# Patient Record
Sex: Female | Born: 1940 | Race: Black or African American | Hispanic: No | Marital: Married | Smoking: Never smoker
Health system: Southern US, Community
[De-identification: ages and names within clinical notes are randomized; demographics above are authoritative.]

## PROBLEM LIST (undated history)

## (undated) DIAGNOSIS — I679 Cerebrovascular disease, unspecified: Secondary | ICD-10-CM

## (undated) DIAGNOSIS — I2510 Atherosclerotic heart disease of native coronary artery without angina pectoris: Secondary | ICD-10-CM

## (undated) DIAGNOSIS — I10 Essential (primary) hypertension: Secondary | ICD-10-CM

## (undated) DIAGNOSIS — E118 Type 2 diabetes mellitus with unspecified complications: Secondary | ICD-10-CM

## (undated) DIAGNOSIS — G459 Transient cerebral ischemic attack, unspecified: Secondary | ICD-10-CM

---

## 1999-02-03 ENCOUNTER — Observation Stay (HOSPITAL_COMMUNITY): Admission: EM | Admit: 1999-02-03 | Discharge: 1999-02-04 | Payer: Self-pay | Admitting: *Deleted

## 1999-02-03 ENCOUNTER — Encounter: Payer: Self-pay | Admitting: *Deleted

## 2014-12-21 ENCOUNTER — Other Ambulatory Visit (HOSPITAL_COMMUNITY): Payer: Self-pay | Admitting: Interventional Radiology

## 2014-12-21 DIAGNOSIS — G459 Transient cerebral ischemic attack, unspecified: Secondary | ICD-10-CM

## 2014-12-26 ENCOUNTER — Other Ambulatory Visit (HOSPITAL_COMMUNITY): Payer: Self-pay | Admitting: Interventional Radiology

## 2014-12-26 ENCOUNTER — Ambulatory Visit (HOSPITAL_COMMUNITY)
Admission: RE | Admit: 2014-12-26 | Discharge: 2014-12-26 | Disposition: A | Discharge status: Home / Self Care | Payer: Medicare Other | Source: Ambulatory Visit | Attending: Interventional Radiology | Admitting: Interventional Radiology

## 2014-12-26 DIAGNOSIS — G459 Transient cerebral ischemic attack, unspecified: Secondary | ICD-10-CM

## 2014-12-26 DIAGNOSIS — I771 Stricture of artery: Secondary | ICD-10-CM

## 2014-12-27 ENCOUNTER — Telehealth (HOSPITAL_COMMUNITY): Payer: Self-pay

## 2014-12-27 ENCOUNTER — Other Ambulatory Visit: Payer: Self-pay | Admitting: Radiology

## 2014-12-27 NOTE — Telephone Encounter (Signed)
Tried to call pt back to give appt time for IR procedure, number in system is not valid. AW

## 2014-12-29 ENCOUNTER — Other Ambulatory Visit: Payer: Self-pay | Admitting: Physician Assistant

## 2014-12-29 ENCOUNTER — Other Ambulatory Visit: Payer: Self-pay | Admitting: Radiology

## 2014-12-30 ENCOUNTER — Other Ambulatory Visit (HOSPITAL_COMMUNITY): Payer: Self-pay | Admitting: Interventional Radiology

## 2014-12-30 ENCOUNTER — Encounter (HOSPITAL_COMMUNITY): Payer: Self-pay

## 2014-12-30 ENCOUNTER — Ambulatory Visit (HOSPITAL_COMMUNITY)
Admission: RE | Admit: 2014-12-30 | Discharge: 2014-12-30 | Disposition: A | Discharge status: Home / Self Care | Payer: Medicare Other | Source: Ambulatory Visit | Attending: Interventional Radiology | Admitting: Interventional Radiology

## 2014-12-30 DIAGNOSIS — Z7982 Long term (current) use of aspirin: Secondary | ICD-10-CM | POA: Insufficient documentation

## 2014-12-30 DIAGNOSIS — I2510 Atherosclerotic heart disease of native coronary artery without angina pectoris: Secondary | ICD-10-CM | POA: Insufficient documentation

## 2014-12-30 DIAGNOSIS — Z91040 Latex allergy status: Secondary | ICD-10-CM | POA: Diagnosis not present

## 2014-12-30 DIAGNOSIS — Z880 Allergy status to penicillin: Secondary | ICD-10-CM | POA: Insufficient documentation

## 2014-12-30 DIAGNOSIS — I771 Stricture of artery: Secondary | ICD-10-CM

## 2014-12-30 DIAGNOSIS — Z8673 Personal history of transient ischemic attack (TIA), and cerebral infarction without residual deficits: Secondary | ICD-10-CM | POA: Insufficient documentation

## 2014-12-30 DIAGNOSIS — G450 Vertebro-basilar artery syndrome: Secondary | ICD-10-CM | POA: Diagnosis not present

## 2014-12-30 DIAGNOSIS — I10 Essential (primary) hypertension: Secondary | ICD-10-CM | POA: Diagnosis not present

## 2014-12-30 HISTORY — DX: Atherosclerotic heart disease of native coronary artery without angina pectoris: I25.10

## 2014-12-30 HISTORY — DX: Essential (primary) hypertension: I10

## 2014-12-30 HISTORY — DX: Cerebrovascular disease, unspecified: I67.9

## 2014-12-30 HISTORY — DX: Transient cerebral ischemic attack, unspecified: G45.9

## 2014-12-30 LAB — CBC WITH DIFFERENTIAL/PLATELET
Basophils Absolute: 0 10*3/uL (ref 0.0–0.1)
Basophils Relative: 0 % (ref 0–1)
EOS ABS: 0.2 10*3/uL (ref 0.0–0.7)
Eosinophils Relative: 3 % (ref 0–5)
HCT: 38.1 % (ref 36.0–46.0)
Hemoglobin: 13 g/dL (ref 12.0–15.0)
LYMPHS ABS: 2.7 10*3/uL (ref 0.7–4.0)
Lymphocytes Relative: 45 % (ref 12–46)
MCH: 30 pg (ref 26.0–34.0)
MCHC: 34.1 g/dL (ref 30.0–36.0)
MCV: 87.8 fL (ref 78.0–100.0)
MONO ABS: 0.4 10*3/uL (ref 0.1–1.0)
Monocytes Relative: 7 % (ref 3–12)
NEUTROS PCT: 45 % (ref 43–77)
Neutro Abs: 2.8 10*3/uL (ref 1.7–7.7)
Platelets: 207 10*3/uL (ref 150–400)
RBC: 4.34 MIL/uL (ref 3.87–5.11)
RDW: 13.4 % (ref 11.5–15.5)
WBC: 6.1 10*3/uL (ref 4.0–10.5)

## 2014-12-30 LAB — BASIC METABOLIC PANEL
Anion gap: 9 (ref 5–15)
BUN: 15 mg/dL (ref 6–20)
CO2: 30 mmol/L (ref 22–32)
CREATININE: 0.82 mg/dL (ref 0.44–1.00)
Calcium: 9.8 mg/dL (ref 8.9–10.3)
Chloride: 100 mmol/L — ABNORMAL LOW (ref 101–111)
GFR calc Af Amer: >60 mL/min (ref >60)
GFR calc non Af Amer: >60 mL/min (ref >60)
Glucose, Bld: 122 mg/dL — ABNORMAL HIGH (ref 65–99)
POTASSIUM: 3.5 mmol/L (ref 3.5–5.1)
Sodium: 139 mmol/L (ref 135–145)

## 2014-12-30 LAB — PROTIME-INR
INR: 1.15 (ref 0.00–1.49)
PROTHROMBIN TIME: 14.9 s (ref 11.6–15.2)

## 2014-12-30 MED ORDER — HEPARIN SODIUM (PORCINE) 1000 UNIT/ML IJ SOLN
INTRAMUSCULAR | Status: AC
  Filled 2014-12-30: qty 1

## 2014-12-30 MED ORDER — MIDAZOLAM HCL 2 MG/2ML IJ SOLN
INTRAMUSCULAR | Status: AC
  Filled 2014-12-30: qty 2

## 2014-12-30 MED ORDER — IOHEXOL 300 MG/ML  SOLN
200.0000 mL | Freq: Once | INTRAMUSCULAR | Status: AC | PRN
  Administered 2014-12-30: 90 mL via INTRAVENOUS

## 2014-12-30 MED ORDER — FENTANYL CITRATE (PF) 100 MCG/2ML IJ SOLN
INTRAMUSCULAR | Status: AC | PRN
  Administered 2014-12-30: 25 ug via INTRAVENOUS

## 2014-12-30 MED ORDER — LIDOCAINE HCL 1 % IJ SOLN
INTRAMUSCULAR | Status: AC
  Filled 2014-12-30: qty 20

## 2014-12-30 MED ORDER — SODIUM CHLORIDE 0.9 % IV SOLN: INTRAVENOUS | Status: AC

## 2014-12-30 MED ORDER — HEPARIN SODIUM (PORCINE) 1000 UNIT/ML IJ SOLN
INTRAMUSCULAR | Status: AC | PRN
  Administered 2014-12-30: 1000 [IU] via INTRAVENOUS

## 2014-12-30 MED ORDER — SODIUM CHLORIDE 0.9 % IV SOLN
INTRAVENOUS | Status: DC
  Administered 2014-12-30: 07:00:00 via INTRAVENOUS

## 2014-12-30 MED ORDER — MIDAZOLAM HCL 2 MG/2ML IJ SOLN
INTRAMUSCULAR | Status: AC | PRN
  Administered 2014-12-30: 1 mg via INTRAVENOUS

## 2014-12-30 MED ORDER — FENTANYL CITRATE (PF) 100 MCG/2ML IJ SOLN
INTRAMUSCULAR | Status: AC
  Filled 2014-12-30: qty 2

## 2014-12-30 NOTE — H&P (Signed)
Chief Complaint: Patient was seen in consultation today for cerebral arteriogram  at the request of Dr. Dewain Penning  Referring Physician(s): Dr. William Hamburger  History of Present Illness: Breanna Nash is a 74 y.o. 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 She was referred to Dr. Corliss Skains, who saw her 8/3 She is now scheduled for cerebral arteriogram.  Past Medical History  Diagnosis Date  . HTN (hypertension)   . TIA (transient ischemic attack)   . CVD (cerebrovascular disease)   . CAD (coronary artery disease)      No past surgical history on file.  Allergies: Penicillins; Percocet; and Latex  Medications: Prior to Admission medications   Medication Sig Start Date End Date Taking? Authorizing Provider  amLODipine (NORVASC) 5 MG tablet Take 5 mg by mouth daily. 11/15/14   Historical Provider, MD  aspirin 325 MG tablet Take 325 mg by mouth daily. 12/06/14 12/06/15  Historical Provider, MD  atenolol (TENORMIN) 50 MG tablet Take 50 mg by mouth daily. 05/10/14   Historical Provider, MD  atorvastatin (LIPITOR) 40 MG tablet Take 40 mg by mouth every evening. 05/10/14   Historical Provider, MD  diclofenac sodium (VOLTAREN) 1 % GEL Apply 2 g topically 4 (four) times daily. 06/10/14 06/10/15  Historical Provider, MD  famotidine (PEPCID) 40 MG tablet Take 40 mg by mouth 2 (two) times daily. 12/06/14 12/06/15  Historical Provider, MD  Hydrocodone-Acetaminophen 10-300 MG TABS Take 1 tablet by mouth every 4 (four) hours as needed (pain).  12/13/14   Historical Provider, MD  lisinopril-hydrochlorothiazide (PRINZIDE,ZESTORETIC) 20-12.5 MG per tablet Take 1 tablet by mouth daily. 11/15/14   Historical Provider, MD  promethazine (PHENERGAN) 25 MG tablet Take 25 mg by mouth every 6 (six) hours as needed for nausea or vomiting.  12/13/14   Historical Provider, MD     No family history on file.  History   Social History  .  Marital Status: Married    Spouse Name: N/A  . Number of Children: N/A  . Years of Education: N/A   Social History Main Topics  . Smoking status: Not on file  . Smokeless tobacco: Not on file  . Alcohol Use: Not on file  . Drug Use: Not on file  . Sexual Activity: Not on file   Other Topics Concern  . Not on file   Social History Narrative  . No narrative on file      Review of Systems: A 12 point ROS discussed and pertinent positives are indicated in the HPI above.  All other systems are negative.  Review of Systems  Vital Signs: BP 145/68 mmHg  Pulse 64  Temp(Src) 97.8 F (36.6 C)  Resp 18  Ht 5\' 2"  (1.575 m)  Wt 175 lb (79.379 kg)  BMI 32.00 kg/m2  SpO2 100%  Physical Exam  Constitutional: She appears well-developed and well-nourished. No distress.  HENT:  Head: Normocephalic.  Mouth/Throat: Oropharynx is clear and moist.  Neck: Normal range of motion. No JVD present. No tracheal deviation present.  Cardiovascular: Normal rate, regular rhythm and normal heart sounds.   Pulmonary/Chest: Effort normal and breath sounds normal. No respiratory distress.  Abdominal: She exhibits no distension.  Neurological: She is alert.  Psychiatric: She has a normal mood and affect. Judgment normal.    Imaging: Ir Radiologist Eval & Mgmt  12/28/2014   EXAM: NEW PATIENT OFFICE VISIT  CHIEF COMPLAINT: Headaches.  Dizziness.  Vertebrobasilar insufficiency.  Current Pain Level: 1-10  HISTORY OF PRESENT ILLNESS: Patient is a 74 year old, right-handed lady who has been referred for evaluation following a transient ischemic attack involving vertebrobasilar insufficiency.  The patient is accompanied by her husband and her son.  According to the patient she was admitted at [facility] on the tenth of this month with symptoms of sudden onset of slurred speech, dizziness comprising of vertigo, difficulty with maintaining her balance, and also dysarthria lasting approximately  20 minutes.  This was apparently proceeded by sharp pain in both her eyes.  The patient on admission there had a blood pressure of approximately 225/115.  This was initially brought under control with the patient's symptoms receding.  During her stay at the hospital, the patient underwent workup comprising of MRI scan of the brain, and CT angiogram of the head and neck.  She also underwent an ultrasound of her neck.  These studies revealed the presence of a probable high-grade stenosis of the proximal left posterior cerebral artery. Additionally there appeared to be suggestion of a moderate to severe narrowing of the left vertebral artery at its origin associated with a calcified plaque.  Since the discharge, the patient has had no further similar spells. She does have a history of chronic hypertension. She admits to not taking her antihypertensive medicines regularly.  Past Medical History: Arthritis. High blood pressure. High cholesterol. Reflux indigestion.  Patient is also apparently waiting to have rotator cuff surgery performed.  Medications: Atenolol. Premarin. Clobetasol. Lisinopril hydrochlorothiazide combination, amlodipine, Protonix. Atorvastatin.  Allergies: No known allergies.  Social History: Patient is married.  Has 2 children alive and well.  She denies drinking alcohol. Denies smoking at this time. However has admitted to smoking in the past. Denies using illicit chemicals.  Family History: Father died of lung carcinoma. Heart problems in her mother. High blood pressure. Mother had a stroke at age 88.  REVIEW OF SYSTEMS: Essentially negative except for as mentioned above.  PHYSICAL EXAMINATION: Apparently slightly anxious.  In no acute distress.  Affect normal.  Neurologically, no lateralizing cranial nerve abnormalities, motor, sensory or coordination problems. Alert, awake, oriented to time, place, space. Speech and comprehension normal.  ASSESSMENT AND PLAN: Patient's recent MRI scan of the brain  and CT angiogram of the brain were reviewed with her and her family.  The MRI scan of the brain reveals nonspecific subcortical white matter changes supratentorially and the centrum semiovale and also in the corona radiata.  The CT angiogram reveals a suggestion of a tight focal stenoses of the proximal left posterior cerebral artery. Additionally there is suggestion of narrowing at the origin of the left vertebral artery which is masked by calcified plaque.  The patient was informed that in order to further accurately delineate the degree of narrowing in the above mentioned blood vessels, a formal catheter angiogram would be needed.  The procedure, the risks, the benefits, the alternatives were all reviewed in detail.  Questions were answered.  The patient and family are willing to proceed with a diagnostic catheter angiogram which will be performed shortly.  Patient has been warned to continue taking her blood pressure on a regular basis. Also she was advised to check with her primary care doctor in regards to testing for diabetes in view of the fact that she has had raised blood glucose levels.  Patient and her family leave with good understanding and agreement with above management plan. They were asked to call should they have any concerns or questions.  Electronically Signed   By: Julieanne Cotton M.D.   On: 12/26/2014 15:50    Labs:  CBC:  Recent Labs  12/30/14 0708  WBC 6.1  HGB 13.0  HCT 38.1  PLT 207    COAGS:  Recent Labs  12/30/14 0708  INR 1.15    BMP: No results for input(s): NA, K, CL, CO2, GLUCOSE, BUN, CALCIUM, CREATININE, GFRNONAA, GFRAA in the last 8760 hours.  Invalid input(s): CMP  LIVER FUNCTION TESTS: No results for input(s): BILITOT, AST, ALT, ALKPHOS, PROT, ALBUMIN in the last 8760 hours.  TUMOR MARKERS: No results for input(s): AFPTM, CEA, CA199, CHROMGRNA in the last 8760 hours.  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 Consent signed in chart  Signed: Brayton El 12/30/2014, 7:49 AM

## 2014-12-30 NOTE — Discharge Instructions (Signed)

## 2014-12-30 NOTE — Sedation Documentation (Signed)
Exoseal intact. Pressure was held and done

## 2014-12-30 NOTE — Sedation Documentation (Addendum)
Exoseal R groin- IR tech holding pressure to R groin; instructions given about bedrest and head/leg restrictions

## 2014-12-30 NOTE — Sedation Documentation (Addendum)
DP pulses 2+, PT 1+

## 2014-12-30 NOTE — Procedures (Signed)
S/P 4 vessel cerebral arteriogram. RT CFA approach. Findings. 1.Approx 75 % stenosis LT PCA P1-2 junction,and 50% RT PCA P1-2 junction

## 2015-08-24 ENCOUNTER — Telehealth (HOSPITAL_COMMUNITY): Payer: Self-pay

## 2015-08-24 NOTE — Telephone Encounter (Signed)
Have made multiple attempts to call pt on home and work to schedule MRI with Dr. Corliss Skainseveshwar, both numbers keep ringing busy. Will try back later. AW

## 2015-10-31 ENCOUNTER — Other Ambulatory Visit (HOSPITAL_COMMUNITY): Payer: Self-pay | Admitting: Interventional Radiology

## 2015-10-31 DIAGNOSIS — I771 Stricture of artery: Secondary | ICD-10-CM

## 2015-11-07 ENCOUNTER — Ambulatory Visit (HOSPITAL_COMMUNITY)
Admission: RE | Admit: 2015-11-07 | Discharge: 2015-11-07 | Disposition: A | Discharge status: Home / Self Care | Payer: Medicare Other | Source: Ambulatory Visit | Attending: Interventional Radiology | Admitting: Interventional Radiology

## 2015-11-07 DIAGNOSIS — I771 Stricture of artery: Secondary | ICD-10-CM | POA: Insufficient documentation

## 2015-11-07 LAB — CREATININE, SERUM
CREATININE: 0.75 mg/dL (ref 0.44–1.00)
GFR calc Af Amer: >60 mL/min (ref >60)

## 2015-11-07 MED ORDER — GADOBENATE DIMEGLUMINE 529 MG/ML IV SOLN
18.0000 mL | Freq: Once | INTRAVENOUS | Status: AC | PRN
  Administered 2015-11-07: 20 mL via INTRAVENOUS

## 2015-12-05 ENCOUNTER — Telehealth (HOSPITAL_COMMUNITY): Payer: Self-pay

## 2015-12-05 NOTE — Telephone Encounter (Signed)
Pt agreed to f/u in 6 months with a MRI. AW 

## 2016-05-07 ENCOUNTER — Other Ambulatory Visit (HOSPITAL_COMMUNITY): Payer: Self-pay | Admitting: Interventional Radiology

## 2016-05-07 DIAGNOSIS — I771 Stricture of artery: Secondary | ICD-10-CM

## 2016-06-19 ENCOUNTER — Ambulatory Visit (HOSPITAL_COMMUNITY)
Admission: RE | Admit: 2016-06-19 | Payer: Medicare Other | Source: Ambulatory Visit | Admitting: Interventional Radiology

## 2016-06-19 ENCOUNTER — Encounter (HOSPITAL_COMMUNITY): Payer: Self-pay

## 2016-06-19 ENCOUNTER — Ambulatory Visit (HOSPITAL_COMMUNITY): Payer: Medicare Other

## 2016-06-28 ENCOUNTER — Telehealth (HOSPITAL_COMMUNITY): Payer: Self-pay

## 2016-06-28 NOTE — Telephone Encounter (Signed)
Called to reschedule mri, left vm. AW

## 2016-07-18 ENCOUNTER — Telehealth (HOSPITAL_COMMUNITY): Payer: Self-pay

## 2016-07-18 NOTE — Telephone Encounter (Signed)
Called to reschedule mri, left message for pt to return call. AW 

## 2016-08-07 ENCOUNTER — Telehealth (HOSPITAL_COMMUNITY): Payer: Self-pay

## 2016-08-07 NOTE — Telephone Encounter (Signed)
Called to reschedule mri, left message for pt to return call. AW 

## 2016-08-23 ENCOUNTER — Ambulatory Visit (HOSPITAL_COMMUNITY): Admission: RE | Admit: 2016-08-23 | Payer: Medicare Other | Source: Ambulatory Visit

## 2016-08-23 ENCOUNTER — Ambulatory Visit (HOSPITAL_COMMUNITY)
Admission: RE | Admit: 2016-08-23 | Discharge: 2016-08-23 | Disposition: A | Discharge status: Home / Self Care | Payer: Medicare Other | Source: Ambulatory Visit | Attending: Interventional Radiology | Admitting: Interventional Radiology

## 2016-08-23 ENCOUNTER — Encounter (HOSPITAL_COMMUNITY): Payer: Self-pay

## 2016-08-23 DIAGNOSIS — I771 Stricture of artery: Secondary | ICD-10-CM | POA: Diagnosis present

## 2016-08-23 DIAGNOSIS — I6782 Cerebral ischemia: Secondary | ICD-10-CM | POA: Insufficient documentation

## 2016-08-23 DIAGNOSIS — I672 Cerebral atherosclerosis: Secondary | ICD-10-CM | POA: Diagnosis not present

## 2016-08-23 LAB — CREATININE, SERUM: Creatinine, Ser: 0.77 mg/dL (ref 0.44–1.00)

## 2016-08-23 MED ORDER — GADOBENATE DIMEGLUMINE 529 MG/ML IV SOLN
15.0000 mL | Freq: Once | INTRAVENOUS | Status: AC | PRN
  Administered 2016-08-23: 15 mL via INTRAVENOUS

## 2016-08-27 ENCOUNTER — Telehealth (HOSPITAL_COMMUNITY): Payer: Self-pay

## 2016-08-27 NOTE — Telephone Encounter (Signed)
Left message for pt to return call. AW 

## 2016-08-29 ENCOUNTER — Telehealth (HOSPITAL_COMMUNITY): Payer: Self-pay

## 2016-08-29 NOTE — Telephone Encounter (Signed)
Pt agreed to f/u in 6 months with mri/mra. AW  

## 2017-01-20 ENCOUNTER — Telehealth (HOSPITAL_COMMUNITY): Payer: Self-pay

## 2017-01-20 NOTE — Telephone Encounter (Signed)
Called to schedule 6 month f/u mri, left message for pt to return call. AW 

## 2017-02-17 ENCOUNTER — Telehealth (HOSPITAL_COMMUNITY): Payer: Self-pay

## 2017-02-17 NOTE — Telephone Encounter (Signed)
Called to schedule f/u mri, left message for pt to return call. AW 

## 2017-03-04 ENCOUNTER — Telehealth (HOSPITAL_COMMUNITY): Payer: Self-pay

## 2017-03-04 NOTE — Telephone Encounter (Signed)
Called to schedule 6 month f/u mri, left message for pt to return call. AW 

## 2017-08-07 IMAGING — MR MR MRA HEAD W/O CM
10 of 13 series · 29 of 48 positions shown · IV contrast (multihance)
Comparison: 11/07/2015.  12/30/2014.

CLINICAL DATA: Followup vertebrobasilar insufficiency.

EXAM:
MRI HEAD WITHOUT AND WITH CONTRAST
MRA HEAD WITHOUT CONTRAST
TECHNIQUE: Multiplanar, multiecho pulse sequences of the brain and surrounding
structures were obtained without and with intravenous contrast.
Angiographic images of the head were obtained using MRA technique
without contrast.
CONTRAST:  15mL MULTIHANCE GADOBENATE DIMEGLUMINE 529 MG/ML IV SOLN

[Series 3: DWI · axial · 3.0mm · 1.09mm/px · z∈[-67,+65]mm · 6 of 90 slices shown (1 of 4)]
[im 1/90]
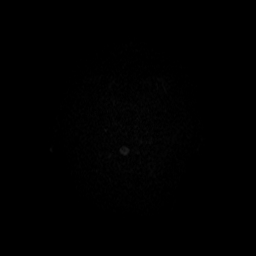
[im 18/90]
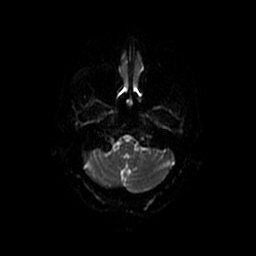
[im 36/90]
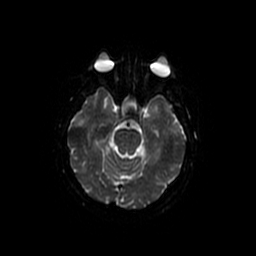
[im 54/90]
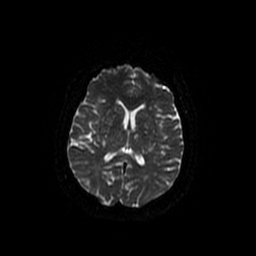
[im 72/90]
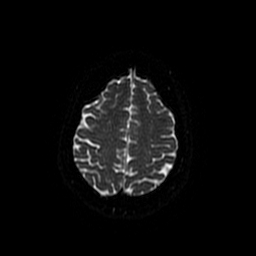
[im 90/90]
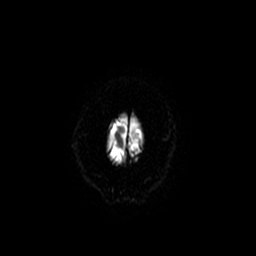

[Series 4: (id) mt fs · axial · 1.4mm · 0.43mm/px · z∈[-86,-51]mm · 3 of 152 slices shown]
[im 1/152]
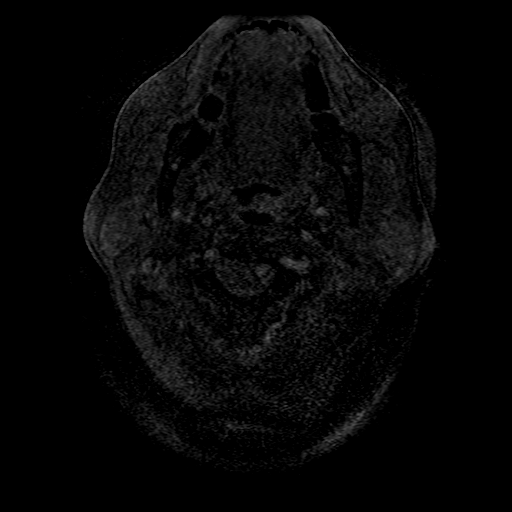
[im 17/152]
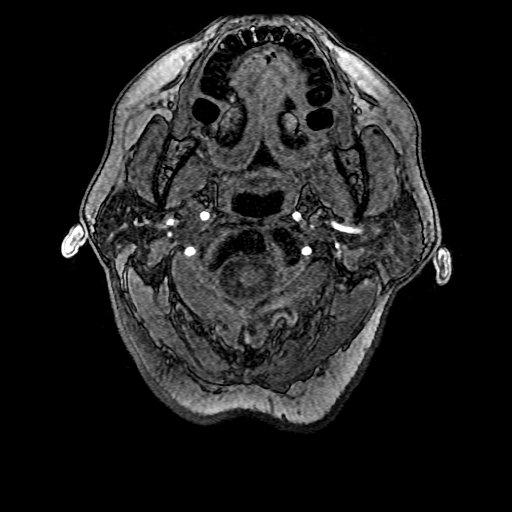
[im 51/152]
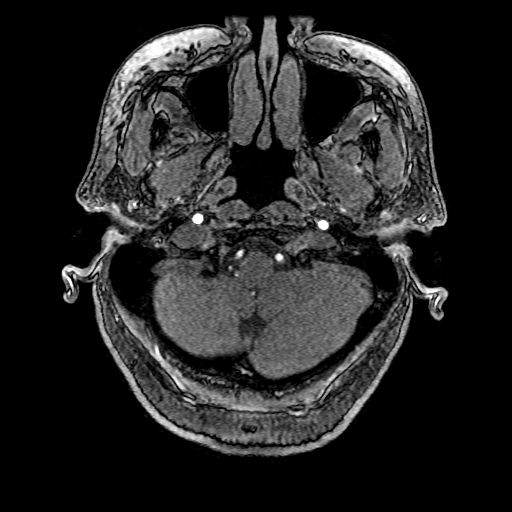

[Series 5: T1 · sagittal · 5.0mm · 0.47mm/px · 2 of 23 slices shown]
[im 1/23]
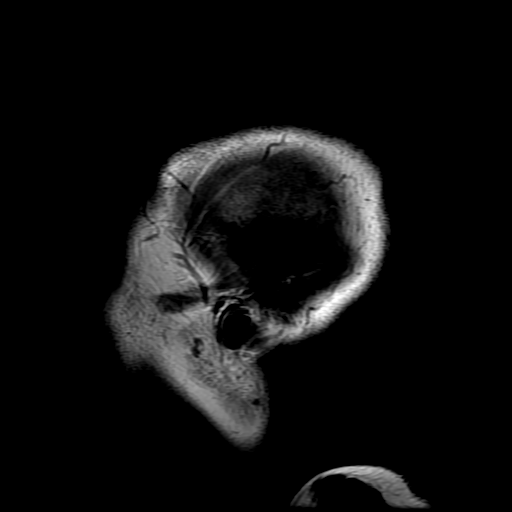
[im 23/23]
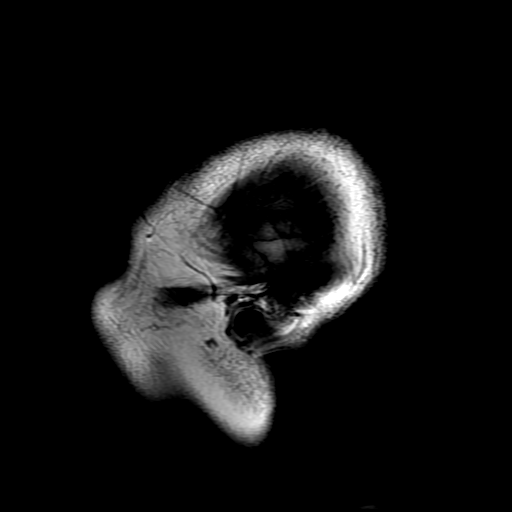

[Series 6: T2 · axial · 5.0mm · 0.43mm/px · z∈[-70,+62]mm · 2 of 23 slices shown]
[im 1/23]
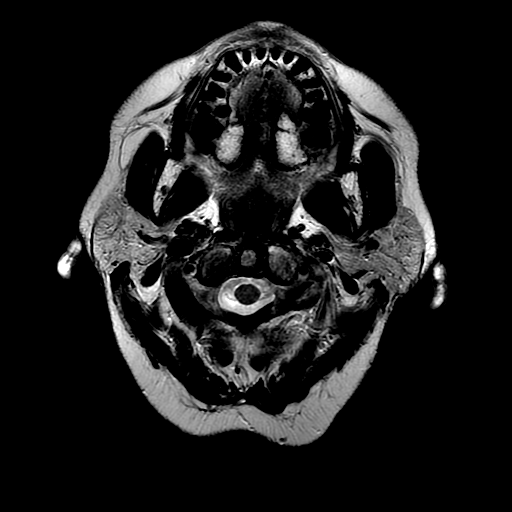
[im 23/23]
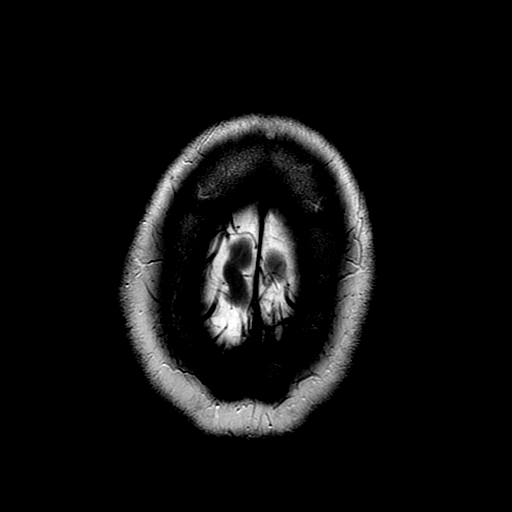

[Series 7: DWI · coronal · 5.0mm · 1.09mm/px · 5 of 62 slices shown (2 of 4)]
[im 1/62]
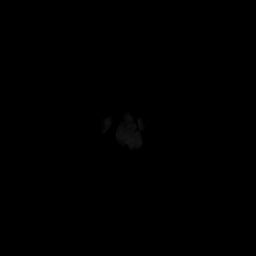
[im 16/62]
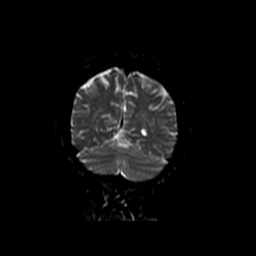
[im 31/62]
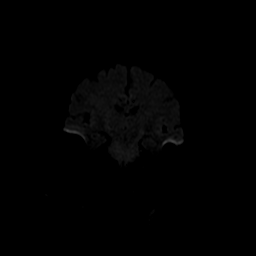
[im 46/62]
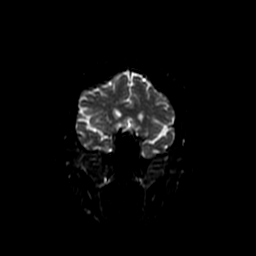
[im 62/62]
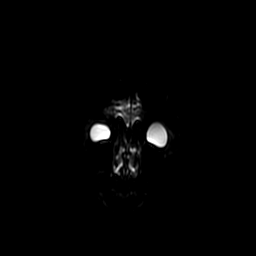

[Series 8: FLAIR · axial · 5.0mm · 0.43mm/px · z∈[-70,+62]mm · 2 of 23 slices shown]
[im 1/23]
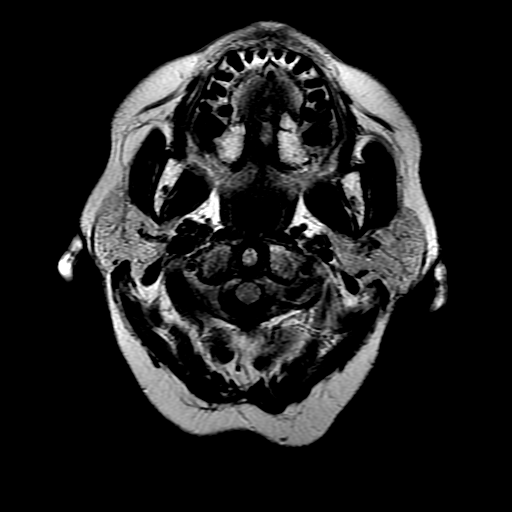
[im 23/23]
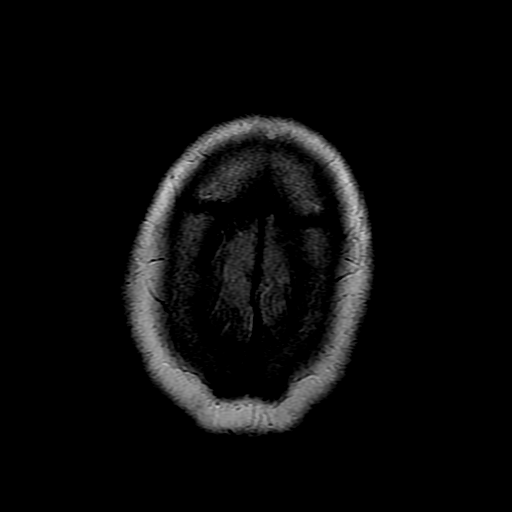

[Series 11: T2 post-contrast · coronal · 5.0mm · 0.39mm/px · 2 of 24 slices shown]
[im 1/24]
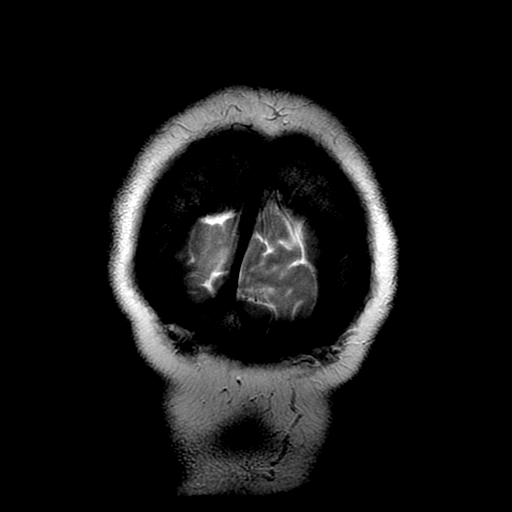
[im 24/24]
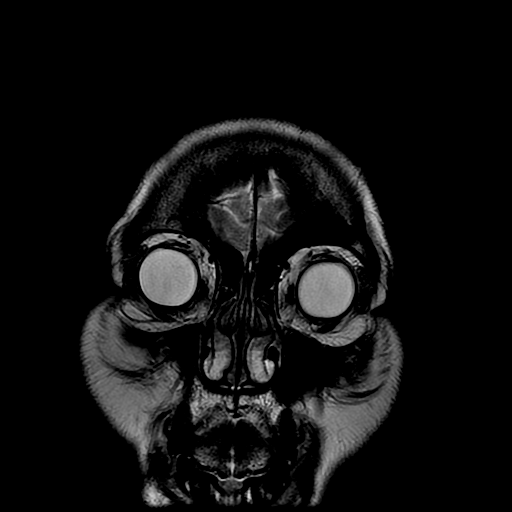

[Series 13: T1 post-contrast · coronal · 5.0mm · 0.39mm/px · 2 of 24 slices shown]
[im 1/24]
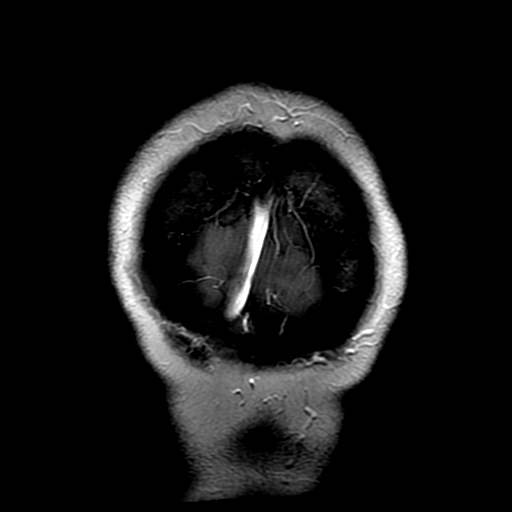
[im 24/24]
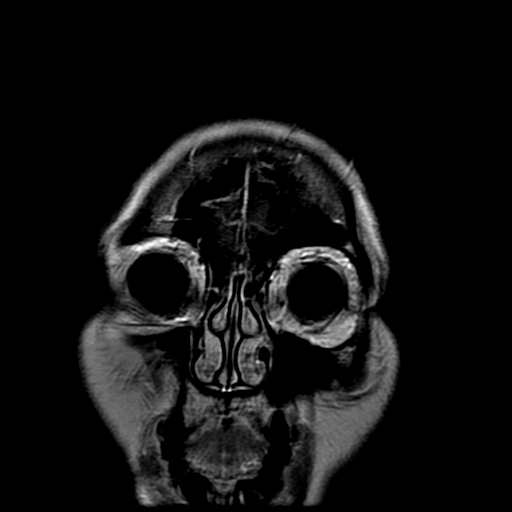

[Series 300: DWI · axial · 3.0mm · 1.09mm/px · z∈[-67,+65]mm · 3 of 45 slices shown (3 of 4)]
[im 1/45]
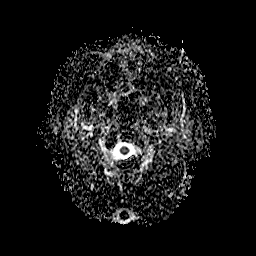
[im 23/45]
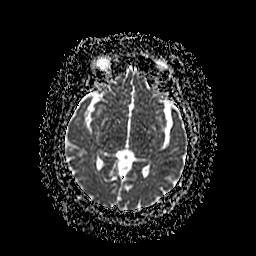
[im 45/45]
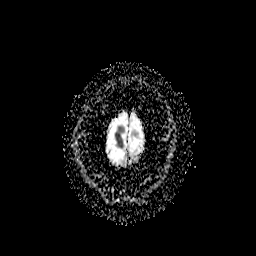

[Series 700: DWI · coronal · 5.0mm · 1.09mm/px · 2 of 31 slices shown (4 of 4)]
[im 1/31]
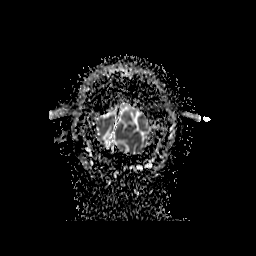
[im 31/31]
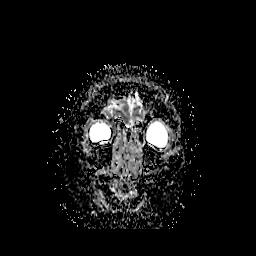

[29 of 48 positions shown; findings below may reference images not displayed]

FINDINGS: MRI HEAD FINDINGS

Brain: Diffusion imaging does not show any acute or subacute
infarction. Mild chronic small-vessel ischemic changes affect the
pons. There are a few old small vessel cerebellar infarctions.
Cerebral hemispheres show chronic small-vessel ischemic changes
affecting the thalami, basal ganglia and hemispheric deep and
subcortical white matter. No large vessel territory infarction. No
mass lesion, hemorrhage, hydrocephalus or extra-axial collection.
Compared to the previous study, no progressive changes are seen.

Vascular: Major vessels at the base of the brain show flow.

Skull and upper cervical spine: Negative

Sinuses/Orbits: Clear/normal

Other: None

MRA HEAD FINDINGS

Both internal carotid arteries widely patent through the skullbase
and siphon regions. The anterior and middle cerebral vessels are
patent without proximal stenosis, aneurysm or vascular malformation.
Moderate narrowing of the superior division M2 branch on the left as
seen previously.

Both vertebral arteries are widely patent to the basilar. No basilar
stenosis. Flow demonstrated in right PICA and left AI cap. Both
superior cerebellar and posterior cerebral artery show flow, with
stenotic disease bilaterally that appears unchanged.
IMPRESSION: Stable examination since 11/07/2015.

Extensive chronic small vessel ischemic changes throughout the
brain.

Intracranial atherosclerotic disease most prominently affecting the
superior cerebellar and posterior cerebral arteries bilaterally.
Stenosis also noted at the superior division M2 branch on the left.

## 2018-03-09 ENCOUNTER — Telehealth (HOSPITAL_COMMUNITY): Payer: Self-pay

## 2018-03-09 NOTE — Telephone Encounter (Signed)
Called to schedule f/u mri, no answer, no vm. AW 

## 2018-05-06 ENCOUNTER — Telehealth (HOSPITAL_COMMUNITY): Payer: Self-pay

## 2018-05-06 NOTE — Telephone Encounter (Signed)
Called to schedule, number in system not working. AW

## 2023-06-03 ENCOUNTER — Emergency Department (HOSPITAL_BASED_OUTPATIENT_CLINIC_OR_DEPARTMENT_OTHER): Payer: Medicare Other

## 2023-06-03 ENCOUNTER — Observation Stay (HOSPITAL_BASED_OUTPATIENT_CLINIC_OR_DEPARTMENT_OTHER)
Admission: EM | Admit: 2023-06-03 | Discharge: 2023-06-05 | Disposition: A | Discharge status: Home / Self Care | Payer: Medicare Other | Attending: Internal Medicine | Admitting: Internal Medicine

## 2023-06-03 ENCOUNTER — Other Ambulatory Visit: Payer: Self-pay

## 2023-06-03 ENCOUNTER — Encounter (HOSPITAL_BASED_OUTPATIENT_CLINIC_OR_DEPARTMENT_OTHER): Payer: Self-pay | Admitting: Urology

## 2023-06-03 DIAGNOSIS — Z79899 Other long term (current) drug therapy: Secondary | ICD-10-CM | POA: Diagnosis not present

## 2023-06-03 DIAGNOSIS — E876 Hypokalemia: Secondary | ICD-10-CM | POA: Diagnosis present

## 2023-06-03 DIAGNOSIS — Z91040 Latex allergy status: Secondary | ICD-10-CM | POA: Insufficient documentation

## 2023-06-03 DIAGNOSIS — Z794 Long term (current) use of insulin: Secondary | ICD-10-CM | POA: Insufficient documentation

## 2023-06-03 DIAGNOSIS — I160 Hypertensive urgency: Secondary | ICD-10-CM | POA: Diagnosis not present

## 2023-06-03 DIAGNOSIS — Z6832 Body mass index (BMI) 32.0-32.9, adult: Secondary | ICD-10-CM | POA: Diagnosis not present

## 2023-06-03 DIAGNOSIS — Z7982 Long term (current) use of aspirin: Secondary | ICD-10-CM | POA: Insufficient documentation

## 2023-06-03 DIAGNOSIS — E669 Obesity, unspecified: Secondary | ICD-10-CM | POA: Diagnosis not present

## 2023-06-03 DIAGNOSIS — R002 Palpitations: Secondary | ICD-10-CM | POA: Diagnosis not present

## 2023-06-03 DIAGNOSIS — Z1152 Encounter for screening for COVID-19: Secondary | ICD-10-CM | POA: Diagnosis not present

## 2023-06-03 DIAGNOSIS — E119 Type 2 diabetes mellitus without complications: Secondary | ICD-10-CM | POA: Insufficient documentation

## 2023-06-03 DIAGNOSIS — I10 Essential (primary) hypertension: Secondary | ICD-10-CM | POA: Diagnosis not present

## 2023-06-03 DIAGNOSIS — R2990 Unspecified symptoms and signs involving the nervous system: Principal | ICD-10-CM | POA: Diagnosis present

## 2023-06-03 DIAGNOSIS — Z8673 Personal history of transient ischemic attack (TIA), and cerebral infarction without residual deficits: Secondary | ICD-10-CM | POA: Diagnosis not present

## 2023-06-03 DIAGNOSIS — R41841 Cognitive communication deficit: Secondary | ICD-10-CM | POA: Diagnosis present

## 2023-06-03 DIAGNOSIS — I639 Cerebral infarction, unspecified: Secondary | ICD-10-CM

## 2023-06-03 DIAGNOSIS — I2510 Atherosclerotic heart disease of native coronary artery without angina pectoris: Secondary | ICD-10-CM | POA: Insufficient documentation

## 2023-06-03 DIAGNOSIS — R29818 Other symptoms and signs involving the nervous system: Secondary | ICD-10-CM | POA: Diagnosis present

## 2023-06-03 DIAGNOSIS — E118 Type 2 diabetes mellitus with unspecified complications: Secondary | ICD-10-CM | POA: Diagnosis present

## 2023-06-03 DIAGNOSIS — Z7902 Long term (current) use of antithrombotics/antiplatelets: Secondary | ICD-10-CM | POA: Insufficient documentation

## 2023-06-03 DIAGNOSIS — E785 Hyperlipidemia, unspecified: Secondary | ICD-10-CM | POA: Diagnosis present

## 2023-06-03 HISTORY — DX: Type 2 diabetes mellitus with unspecified complications: E11.8

## 2023-06-03 LAB — ETHANOL: Alcohol, Ethyl (B): <10 mg/dL (ref <10)

## 2023-06-03 LAB — PROTIME-INR
INR: 1.1 (ref 0.8–1.2)
Prothrombin Time: 14.3 s (ref 11.4–15.2)

## 2023-06-03 LAB — CBC WITH DIFFERENTIAL/PLATELET
Abs Immature Granulocytes: 0.01 10*3/uL (ref 0.00–0.07)
Basophils Absolute: 0 10*3/uL (ref 0.0–0.1)
Basophils Relative: 0 %
Eosinophils Absolute: 0.2 10*3/uL (ref 0.0–0.5)
Eosinophils Relative: 4 %
HCT: 40.2 % (ref 36.0–46.0)
Hemoglobin: 14 g/dL (ref 12.0–15.0)
Immature Granulocytes: 0 %
Lymphocytes Relative: 44 %
Lymphs Abs: 2.8 10*3/uL (ref 0.7–4.0)
MCH: 30.4 pg (ref 26.0–34.0)
MCHC: 34.8 g/dL (ref 30.0–36.0)
MCV: 87.4 fL (ref 80.0–100.0)
Monocytes Absolute: 0.3 10*3/uL (ref 0.1–1.0)
Monocytes Relative: 5 %
Neutro Abs: 3 10*3/uL (ref 1.7–7.7)
Neutrophils Relative %: 47 %
Platelets: 227 10*3/uL (ref 150–400)
RBC: 4.6 MIL/uL (ref 3.87–5.11)
RDW: 13.7 % (ref 11.5–15.5)
WBC: 6.4 10*3/uL (ref 4.0–10.5)
nRBC: 0 % (ref 0.0–0.2)

## 2023-06-03 LAB — COMPREHENSIVE METABOLIC PANEL
ALT: 32 U/L (ref 0–44)
AST: 30 U/L (ref 15–41)
Albumin: 3.8 g/dL (ref 3.5–5.0)
Alkaline Phosphatase: 115 U/L (ref 38–126)
Anion gap: 10 (ref 5–15)
BUN: 12 mg/dL (ref 8–23)
CO2: 27 mmol/L (ref 22–32)
Calcium: 9.8 mg/dL (ref 8.9–10.3)
Chloride: 100 mmol/L (ref 98–111)
Creatinine, Ser: 0.78 mg/dL (ref 0.44–1.00)
GFR, Estimated: >60 mL/min (ref >60)
Glucose, Bld: 183 mg/dL — ABNORMAL HIGH (ref 70–99)
Potassium: 3.4 mmol/L — ABNORMAL LOW (ref 3.5–5.1)
Sodium: 137 mmol/L (ref 135–145)
Total Bilirubin: 0.4 mg/dL (ref 0.0–1.2)
Total Protein: 8 g/dL (ref 6.5–8.1)

## 2023-06-03 LAB — TROPONIN I (HIGH SENSITIVITY): Troponin I (High Sensitivity): 5 ng/L (ref <18)

## 2023-06-03 LAB — CBG MONITORING, ED: Glucose-Capillary: 182 mg/dL — ABNORMAL HIGH (ref 70–99)

## 2023-06-03 LAB — APTT: aPTT: 27 s (ref 24–36)

## 2023-06-03 MED ORDER — CLOPIDOGREL BISULFATE 300 MG PO TABS
300.0000 mg | ORAL_TABLET | Freq: Once | ORAL | Status: AC
  Administered 2023-06-04: 300 mg via ORAL
  Filled 2023-06-03: qty 1

## 2023-06-03 MED ORDER — IOHEXOL 350 MG/ML SOLN
75.0000 mL | Freq: Once | INTRAVENOUS | Status: AC | PRN
  Administered 2023-06-03: 75 mL via INTRAVENOUS

## 2023-06-03 NOTE — ED Notes (Signed)
 VAN negative No facial droop noted, no speech concerns, normal grips bilaterally  No weakness

## 2023-06-03 NOTE — Progress Notes (Addendum)
 2234 - stroke alert activated for this patient with headache, left arm numbness and weakness but states her symptoms have improved. Denies thinners. LKWT 2000 per the patient.   2238 - patient going to CT.   2247 - TSMD page sent. patient back from CT.  2249 -  TSMD on cart to see the patient, report given.   2309 - TSMD off the cart. Patient going back for advanced imaging. TSRN to monitor advanced imaging and report to TSMD. TSMD to make recommendations to EDP.   2334 - Patient going back for CT.

## 2023-06-03 NOTE — ED Provider Notes (Signed)
 [place] [facility] Provider Note   CSN: [id number] Arrival date & time: 06/03/23  2155  An emergency department physician performed an initial assessment on this suspected stroke patient at 2233.  History  Chief Complaint  Patient presents with   Dizziness   Hypertension    Breanna Nash is a 83 y.o. female.  Patient here with acute dizziness left-sided numbness in the arm and leg with brief left-sided arm weakness that started around 8:00.  She was walking and got dizzy numb in the left arm and leg with maybe some left arm weakness.  She has ongoing left arm and leg numbness, dizzy feeling imbalance.  She is able to hold onto her family member in order to get to the car.  She had high blood pressure at home 220 systolic.  She is not having a headache or neck pain.  No ongoing weakness.  She is having some trouble with swallowing that she thinks.  She denies any chest pain shortness of breath vision loss difficulty with speech or aphasia.  She has history of TIA high cholesterol high blood pressure.  The history is provided by the patient.       Home Medications Prior to Admission medications   Medication Sig Start Date End Date Taking? Authorizing Provider  amLODipine  (NORVASC ) 5 MG tablet Take 5 mg by mouth daily. 11/15/14   [provider]  atenolol  (TENORMIN ) 50 MG tablet Take 50 mg by mouth daily. 05/10/14   [provider]  atorvastatin  (LIPITOR ) 40 MG tablet Take 40 mg by mouth every evening. 05/10/14   [provider]  famotidine (PEPCID) 40 MG tablet Take 40 mg by mouth 2 (two) times daily. 12/06/14 12/06/15  [provider]  ibuprofen (ADVIL,MOTRIN) 200 MG tablet Take 400 mg by mouth every 8 (eight) hours as needed for headache or moderate pain.    [provider]  lisinopril-hydrochlorothiazide (PRINZIDE,ZESTORETIC) 20-12.5 MG per tablet Take 1 tablet by mouth daily. 11/15/14   [provider]      Allergies    Penicillins, Percocet [oxycodone-acetaminophen ], and Latex    Review of Systems   Review of Systems  Physical Exam Updated Vital Signs BP (!) 192/88   Pulse (!) 101   Temp 98.4 F (36.9 C) (Oral)   Resp 15   Ht 5' 2 (1.575 m)   Wt 79.4 kg   SpO2 98%   BMI 32.02 kg/m  Physical Exam Vitals and nursing note reviewed.  Constitutional:      General: She is not in acute distress.    Appearance: She is well-developed. She is not ill-appearing.  HENT:     Head: Normocephalic and atraumatic.     Nose: Nose normal.     Mouth/Throat:     Mouth: Mucous membranes are moist.  Eyes:     Extraocular Movements: Extraocular movements intact.     Conjunctiva/sclera: Conjunctivae normal.     Pupils: Pupils are equal, round, and reactive to light.  Cardiovascular:     Rate and Rhythm: Normal rate and regular rhythm.     Pulses: Normal pulses.     Heart sounds: Normal heart sounds. No murmur heard. Pulmonary:     Effort: Pulmonary effort is normal. No respiratory distress.     Breath sounds: Normal breath sounds.  Abdominal:     Palpations: Abdomen is soft.     Tenderness: There is no abdominal tenderness.  Musculoskeletal:  General: No swelling.     Cervical back: Normal range of motion and neck supple.  Skin:    General: Skin is warm and dry.     Capillary Refill: Capillary refill takes less than 2 seconds.  Neurological:     Mental Status: She is alert.     Comments: Numbness in the left arm and left leg, may be mild weakness in the left arm, strength and sensation normal otherwise, no obvious nystagmus, normal visual fields, normal speech, no aphasia, normal coordination normal cranial nerves  Psychiatric:        Mood and Affect: Mood normal.     ED Results / Procedures / Treatments   Labs (all labs ordered are listed, but only abnormal results are displayed) Labs Reviewed  CBG MONITORING, ED - Abnormal; Notable for the following  components:      Result Value   Glucose-Capillary 182 (*)    All other components within normal limits  RESP PANEL BY RT-PCR (RSV, FLU A&B, COVID)  [id number]  CBC WITH DIFFERENTIAL/PLATELET  PROTIME-INR  APTT  COMPREHENSIVE METABOLIC PANEL  URINALYSIS, ROUTINE W REFLEX MICROSCOPIC  ETHANOL  RAPID URINE DRUG SCREEN, HOSP PERFORMED  TROPONIN I (HIGH SENSITIVITY)    EKG None  Radiology CT HEAD CODE STROKE WO CONTRAST` Result Date: 06/03/2023 CLINICAL DATA:  Code stroke. Initial evaluation for acute left upper extremity weakness. EXAM: CT HEAD WITHOUT CONTRAST TECHNIQUE: Contiguous axial images were obtained from the base of the skull through the vertex without intravenous contrast. RADIATION DOSE REDUCTION: This exam was performed according to the departmental dose-optimization program which includes automated exposure control, adjustment of the mA and/or kV according to patient size and/or use of iterative reconstruction technique. COMPARISON:  Prior study from 12/07/2016. FINDINGS: Brain: Cerebral volume within normal limits. Patchy hypodensity involving the supratentorial cerebral white matter, consistent with chronic small vessel ischemic disease, similar to prior. No acute intracranial hemorrhage. No acute large vessel territory infarct. No mass lesion or midline shift. No hydrocephalus or extra-axial fluid collection. Partially empty sella noted. Vascular: No abnormal hyperdense vessel. Skull: Scalp soft tissues and calvarium within normal limits. Sinuses/Orbits: Globes and orbital soft tissues within normal limits. Paranasal sinuses and mastoid air cells are largely clear. Other: None. ASPECTS [facility] Stroke Program Early CT Score) - Ganglionic level infarction (caudate, lentiform nuclei, internal capsule, insula, M1-M3 cortex): 7 - Supraganglionic infarction (M4-M6 cortex): 3 Total score (0-10 with 10 being normal): 10 IMPRESSION: 1. No acute intracranial abnormality. Aspects is 10. 2. Moderate  chronic microvascular ischemic disease, similar to prior. Critical Value/emergent results were called by telephone at the time of interpretation on 06/03/2023 at 10:56 pm to provider Elias Dennington , who verbally acknowledged these results. Electronically Signed   By: Morene Hoard M.D.   On: 06/03/2023 22:56    Procedures .Critical Care  Performed by: Ruthe Cornet, DO Authorized by: Ruthe Cornet, DO   Critical care provider statement:    Critical care time (minutes):  35   Critical care was necessary to treat or prevent imminent or life-threatening deterioration of the following conditions:  CNS failure or compromise   Critical care was time spent personally by me on the following activities:  Development of treatment plan with patient or surrogate, discussions with primary provider, discussions with consultants, blood draw for specimens, evaluation of patient's response to treatment, examination of patient, obtaining history from patient or surrogate, ordering and performing treatments and interventions, ordering and review of laboratory studies, ordering and review of radiographic  studies, pulse oximetry, re-evaluation of patient's condition and review of old charts   I assumed direction of critical care for this patient from another provider in my specialty: no       Medications Ordered in ED Medications  clopidogrel  (PLAVIX ) tablet 300 mg (has no administration in time range)    ED Course/ Medical Decision Making/ A&P                                 Medical Decision Making Amount and/or Complexity of Data Reviewed Labs: ordered. Radiology: ordered.  Risk Prescription drug management. Decision regarding hospitalization.   Ramonda Galyon is here with strokelike symptoms.  Dizziness, left arm left leg numbness and left arm weakness at 8 PM about 3 hours ago.  She still has dizziness left arm and leg numbness but weakness has resolved.  She has no aphasia no cranial  nerve deficits, normal visual fields.  I enacted a code stroke.  Neurology evaluated her.  She had a negative head CT for head bleed or acute process otherwise.  Neurology recommends Plavix  load and admission for stroke workup.  Patient to get a CTA of the head and neck and will get an MRI on admission.  She had lab work done that showed no significant leukocytosis or anemia.  EKG shows sinus rhythm.  No ischemic changes.  Ultimately this sounds like stroke versus TIA seems less likely to be cardiac process.  Could be blood pressure related.  Will allow for permissive hypertension at this time.  Blood pressure 190/88.  Overall we will admit to hospitalist service for further care.  CTAs pending at time of handoff to hospitalist team.  Clinically we have low suspicion for LVO.  We did consider TNK but given her minimal symptoms this was held.  Loaded with Plavix  instead.  She had already taken aspirin  today.  This chart was dictated using voice recognition software.  Despite best efforts to proofread,  errors can occur which can change the documentation meaning.         Final Clinical Impression(s) / ED Diagnoses Final diagnoses:  Stroke-like symptoms    Rx / DC Orders ED Discharge Orders     None         Ruthe Cornet, DO 06/03/23 2325
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
## 2023-06-04 ENCOUNTER — Observation Stay (HOSPITAL_COMMUNITY): Payer: Medicare Other

## 2023-06-04 ENCOUNTER — Encounter (HOSPITAL_COMMUNITY): Payer: Self-pay | Admitting: Internal Medicine

## 2023-06-04 DIAGNOSIS — Z794 Long term (current) use of insulin: Secondary | ICD-10-CM

## 2023-06-04 DIAGNOSIS — G459 Transient cerebral ischemic attack, unspecified: Secondary | ICD-10-CM | POA: Diagnosis not present

## 2023-06-04 DIAGNOSIS — E669 Obesity, unspecified: Secondary | ICD-10-CM | POA: Diagnosis not present

## 2023-06-04 DIAGNOSIS — R29818 Other symptoms and signs involving the nervous system: Secondary | ICD-10-CM | POA: Diagnosis present

## 2023-06-04 DIAGNOSIS — Z8673 Personal history of transient ischemic attack (TIA), and cerebral infarction without residual deficits: Secondary | ICD-10-CM | POA: Diagnosis not present

## 2023-06-04 DIAGNOSIS — Z7982 Long term (current) use of aspirin: Secondary | ICD-10-CM | POA: Diagnosis not present

## 2023-06-04 DIAGNOSIS — I160 Hypertensive urgency: Secondary | ICD-10-CM | POA: Diagnosis not present

## 2023-06-04 DIAGNOSIS — I2510 Atherosclerotic heart disease of native coronary artery without angina pectoris: Secondary | ICD-10-CM | POA: Diagnosis not present

## 2023-06-04 DIAGNOSIS — Z1152 Encounter for screening for COVID-19: Secondary | ICD-10-CM | POA: Diagnosis not present

## 2023-06-04 DIAGNOSIS — Z7902 Long term (current) use of antithrombotics/antiplatelets: Secondary | ICD-10-CM | POA: Diagnosis not present

## 2023-06-04 DIAGNOSIS — R002 Palpitations: Secondary | ICD-10-CM

## 2023-06-04 DIAGNOSIS — Z91040 Latex allergy status: Secondary | ICD-10-CM | POA: Diagnosis not present

## 2023-06-04 DIAGNOSIS — R2990 Unspecified symptoms and signs involving the nervous system: Principal | ICD-10-CM | POA: Diagnosis present

## 2023-06-04 DIAGNOSIS — E876 Hypokalemia: Secondary | ICD-10-CM

## 2023-06-04 DIAGNOSIS — Z79899 Other long term (current) drug therapy: Secondary | ICD-10-CM | POA: Diagnosis not present

## 2023-06-04 DIAGNOSIS — Z6832 Body mass index (BMI) 32.0-32.9, adult: Secondary | ICD-10-CM | POA: Diagnosis not present

## 2023-06-04 DIAGNOSIS — E785 Hyperlipidemia, unspecified: Secondary | ICD-10-CM | POA: Diagnosis present

## 2023-06-04 DIAGNOSIS — I10 Essential (primary) hypertension: Secondary | ICD-10-CM | POA: Diagnosis not present

## 2023-06-04 DIAGNOSIS — E118 Type 2 diabetes mellitus with unspecified complications: Secondary | ICD-10-CM

## 2023-06-04 DIAGNOSIS — E119 Type 2 diabetes mellitus without complications: Secondary | ICD-10-CM | POA: Diagnosis not present

## 2023-06-04 LAB — CBG MONITORING, ED: Glucose-Capillary: 141 mg/dL — ABNORMAL HIGH (ref 70–99)

## 2023-06-04 LAB — RESP PANEL BY RT-PCR (RSV, FLU A&B, COVID)  RVPGX2
Influenza A by PCR: NEGATIVE
Influenza B by PCR: NEGATIVE
Resp Syncytial Virus by PCR: NEGATIVE
SARS Coronavirus 2 by RT PCR: NEGATIVE

## 2023-06-04 LAB — URINALYSIS, ROUTINE W REFLEX MICROSCOPIC
Bilirubin Urine: NEGATIVE
Glucose, UA: NEGATIVE mg/dL
Hgb urine dipstick: NEGATIVE
Ketones, ur: NEGATIVE mg/dL
Leukocytes,Ua: NEGATIVE
Nitrite: NEGATIVE
Protein, ur: NEGATIVE mg/dL
Specific Gravity, Urine: 1.015 (ref 1.005–1.030)
pH: 7 (ref 5.0–8.0)

## 2023-06-04 LAB — ECHOCARDIOGRAM COMPLETE
AR max vel: 2.13 cm2
AV Area VTI: 2.08 cm2
AV Area mean vel: 2.02 cm2
AV Mean grad: 5 mm[Hg]
AV Peak grad: 9.2 mm[Hg]
Ao pk vel: 1.52 m/s
Area-P 1/2: 2.62 cm2
Height: 62 in
P 1/2 time: 594 ms
S' Lateral: 2.1 cm
Weight: 2800.72 [oz_av]

## 2023-06-04 LAB — RAPID URINE DRUG SCREEN, HOSP PERFORMED
Amphetamines: NOT DETECTED
Barbiturates: NOT DETECTED
Benzodiazepines: NOT DETECTED
Cocaine: NOT DETECTED
Opiates: NOT DETECTED
Tetrahydrocannabinol: NOT DETECTED

## 2023-06-04 LAB — LIPID PANEL
Cholesterol: 134 mg/dL (ref 0–200)
HDL: 32 mg/dL — ABNORMAL LOW (ref >40)
LDL Cholesterol: 93 mg/dL (ref 0–99)
Total CHOL/HDL Ratio: 4.2 {ratio}
Triglycerides: 47 mg/dL (ref <150)
VLDL: 9 mg/dL (ref 0–40)

## 2023-06-04 LAB — GLUCOSE, CAPILLARY
Glucose-Capillary: 125 mg/dL — ABNORMAL HIGH (ref 70–99)
Glucose-Capillary: 115 mg/dL — ABNORMAL HIGH (ref 70–99)
Glucose-Capillary: 157 mg/dL — ABNORMAL HIGH (ref 70–99)

## 2023-06-04 LAB — TROPONIN I (HIGH SENSITIVITY): Troponin I (High Sensitivity): 5 ng/L (ref <18)

## 2023-06-04 LAB — MAGNESIUM: Magnesium: 1.9 mg/dL (ref 1.7–2.4)

## 2023-06-04 LAB — TSH: TSH: 0.958 u[IU]/mL (ref 0.350–4.500)

## 2023-06-04 MED ORDER — ONDANSETRON HCL 4 MG/2ML IJ SOLN: 4.0000 mg | Freq: Four times a day (QID) | INTRAMUSCULAR | Status: DC | PRN

## 2023-06-04 MED ORDER — CLOPIDOGREL BISULFATE 75 MG PO TABS
75.0000 mg | ORAL_TABLET | Freq: Every day | ORAL | Status: DC
  Administered 2023-06-05: 75 mg via ORAL
  Filled 2023-06-04 (×2): qty 1

## 2023-06-04 MED ORDER — ATORVASTATIN CALCIUM 40 MG PO TABS: 40.0000 mg | ORAL_TABLET | Freq: Every evening | ORAL | Status: DC

## 2023-06-04 MED ORDER — POTASSIUM CHLORIDE CRYS ER 20 MEQ PO TBCR
40.0000 meq | EXTENDED_RELEASE_TABLET | ORAL | Status: AC
  Administered 2023-06-04: 40 meq via ORAL
  Filled 2023-06-04: qty 2

## 2023-06-04 MED ORDER — HYDRALAZINE HCL 20 MG/ML IJ SOLN: 10.0000 mg | INTRAMUSCULAR | Status: DC | PRN

## 2023-06-04 MED ORDER — INSULIN ASPART 100 UNIT/ML IJ SOLN
0.0000 [IU] | Freq: Three times a day (TID) | INTRAMUSCULAR | Status: DC
  Administered 2023-06-05: 1 [IU] via SUBCUTANEOUS
  Administered 2023-06-05: 2 [IU] via SUBCUTANEOUS

## 2023-06-04 MED ORDER — ACETAMINOPHEN 325 MG PO TABS: 650.0000 mg | ORAL_TABLET | ORAL | Status: DC | PRN

## 2023-06-04 MED ORDER — INSULIN GLARGINE-YFGN 100 UNIT/ML ~~LOC~~ SOLN
10.0000 [IU] | Freq: Every day | SUBCUTANEOUS | Status: DC
  Administered 2023-06-04: 10 [IU] via SUBCUTANEOUS
  Filled 2023-06-04 (×2): qty 0.1

## 2023-06-04 MED ORDER — ACETAMINOPHEN 160 MG/5ML PO SOLN: 650.0000 mg | ORAL | Status: DC | PRN

## 2023-06-04 MED ORDER — SODIUM CHLORIDE 0.9 % IV SOLN: INTRAVENOUS | Status: AC

## 2023-06-04 MED ORDER — ATORVASTATIN CALCIUM 80 MG PO TABS
80.0000 mg | ORAL_TABLET | Freq: Every day | ORAL | Status: DC
  Administered 2023-06-04 – 2023-06-05 (×2): 80 mg via ORAL
  Filled 2023-06-04 (×2): qty 1

## 2023-06-04 MED ORDER — STROKE: EARLY STAGES OF RECOVERY BOOK
Freq: Once | Status: AC
Start: 2023-06-05 — End: 2023-06-05
  Filled 2023-06-04: qty 1

## 2023-06-04 MED ORDER — ENOXAPARIN SODIUM 40 MG/0.4ML IJ SOSY
40.0000 mg | PREFILLED_SYRINGE | INTRAMUSCULAR | Status: DC
  Administered 2023-06-04 – 2023-06-05 (×2): 40 mg via SUBCUTANEOUS
  Filled 2023-06-04 (×2): qty 0.4

## 2023-06-04 MED ORDER — SENNOSIDES-DOCUSATE SODIUM 8.6-50 MG PO TABS: 1.0000 | ORAL_TABLET | Freq: Every evening | ORAL | Status: DC | PRN

## 2023-06-04 MED ORDER — ACETAMINOPHEN 650 MG RE SUPP: 650.0000 mg | RECTAL | Status: DC | PRN

## 2023-06-04 MED ORDER — ASPIRIN 81 MG PO TBEC
81.0000 mg | DELAYED_RELEASE_TABLET | Freq: Every day | ORAL | Status: DC
  Administered 2023-06-04 – 2023-06-05 (×2): 81 mg via ORAL
  Filled 2023-06-04 (×2): qty 1

## 2023-06-04 MED ORDER — ONDANSETRON HCL 4 MG/2ML IJ SOLN
4.0000 mg | Freq: Once | INTRAMUSCULAR | Status: AC
  Administered 2023-06-04: 4 mg via INTRAVENOUS
  Filled 2023-06-04: qty 2

## 2023-06-04 NOTE — Progress Notes (Signed)
 Plan of Care Note for accepted transfer   Patient: Breanna Nash MRN: [id number]   DOA: 06/03/2023  Facility requesting transfer: [facility]. Requesting Provider: Dr. Avonne. Reason for transfer: Possible stroke. Facility course: 83 year old female with history of diabetes mellitus type 2 and hypertension was brought to the ER after patient had persistent dizziness and started having left-sided weakness.  Patient's easiness started this morning and later in the day started.  And seeing left-sided numbness and weakness.  CT head is unremarkable.  Patient was evaluated by teleneurologist in the ER and at this time felt patient was not a candidate for thrombolytics and to admit for further stroke workup.  CT angiogram head and neck is pending.  Plavix  loading dose was given.  Plan of care: The patient is accepted for admission to Telemetry unit, at [facility].  Author: Redia LOISE Cleaver, MD 06/04/2023  Check [email or web address] for on-call coverage.  Nursing staff, Please call [facility] Admits & Consults System-Wide number on Amion as soon as patient's arrival, so appropriate admitting provider can evaluate the pt.

## 2023-06-04 NOTE — Progress Notes (Signed)
 OT Cancellation Note  Patient Details Name: Breanna Nash MRN: [id number] DOB: 1941/04/29   Cancelled Treatment:    Reason Eval/Treat Not Completed: Other (comment)--pt is in the middle of eating her lunch, will check back as schedule allows.  Donny BECKER OT Acute Rehabilitation Services Office [phone]    Rodgers Dorothyann Distel 06/04/2023, 2:35 PM
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
## 2023-06-04 NOTE — Plan of Care (Signed)
  Problem: Education: Goal: Knowledge of General Education information will improve Description: Including pain rating scale, medication(s)/side effects and non-pharmacologic comfort measures Outcome: Progressing   Problem: Health Behavior/Discharge Planning: Goal: Ability to manage health-related needs will improve Outcome: Progressing   Problem: Clinical Measurements: Goal: Ability to maintain clinical measurements within normal limits will improve Outcome: Progressing Goal: Will remain free from infection Outcome: Progressing Goal: Diagnostic test results will improve Outcome: Progressing Goal: Respiratory complications will improve Outcome: Progressing Goal: Cardiovascular complication will be avoided Outcome: Progressing   Problem: Activity: Goal: Risk for activity intolerance will decrease Outcome: Progressing   Problem: Nutrition: Goal: Adequate nutrition will be maintained Outcome: Progressing   Problem: Coping: Goal: Level of anxiety will decrease Outcome: Progressing   Problem: Elimination: Goal: Will not experience complications related to bowel motility Outcome: Progressing Goal: Will not experience complications related to urinary retention Outcome: Progressing   Problem: Pain Management: Goal: General experience of comfort will improve Outcome: Progressing   Problem: Safety: Goal: Ability to remain free from injury will improve Outcome: Progressing   Problem: Skin Integrity: Goal: Risk for impaired skin integrity will decrease Outcome: Progressing   Problem: Education: Goal: Knowledge of disease or condition will improve Outcome: Progressing Goal: Knowledge of secondary prevention will improve (MUST DOCUMENT ALL) Outcome: Progressing Goal: Knowledge of patient specific risk factors will improve Loraine Leriche N/A or DELETE if not current risk factor) Outcome: Progressing   Problem: Ischemic Stroke/TIA Tissue Perfusion: Goal: Complications of ischemic  stroke/TIA will be minimized Outcome: Progressing   Problem: Coping: Goal: Will verbalize positive feelings about self Outcome: Progressing Goal: Will identify appropriate support needs Outcome: Progressing   Problem: Health Behavior/Discharge Planning: Goal: Ability to manage health-related needs will improve Outcome: Progressing Goal: Goals will be collaboratively established with patient/family Outcome: Progressing   Problem: Self-Care: Goal: Ability to participate in self-care as condition permits will improve Outcome: Progressing Goal: Verbalization of feelings and concerns over difficulty with self-care will improve Outcome: Progressing Goal: Ability to communicate needs accurately will improve Outcome: Progressing   Problem: Nutrition: Goal: Risk of aspiration will decrease Outcome: Progressing Goal: Dietary intake will improve Outcome: Progressing   Problem: Education: Goal: Ability to describe self-care measures that may prevent or decrease complications (Diabetes Survival Skills Education) will improve Outcome: Progressing Goal: Individualized Educational Video(s) Outcome: Progressing   Problem: Coping: Goal: Ability to adjust to condition or change in health will improve Outcome: Progressing   Problem: Fluid Volume: Goal: Ability to maintain a balanced intake and output will improve Outcome: Progressing   Problem: Health Behavior/Discharge Planning: Goal: Ability to identify and utilize available resources and services will improve Outcome: Progressing Goal: Ability to manage health-related needs will improve Outcome: Progressing   Problem: Metabolic: Goal: Ability to maintain appropriate glucose levels will improve Outcome: Progressing   Problem: Nutritional: Goal: Maintenance of adequate nutrition will improve Outcome: Progressing Goal: Progress toward achieving an optimal weight will improve Outcome: Progressing   Problem: Skin  Integrity: Goal: Risk for impaired skin integrity will decrease Outcome: Progressing   Problem: Tissue Perfusion: Goal: Adequacy of tissue perfusion will improve Outcome: Progressing

## 2023-06-04 NOTE — Evaluation (Signed)
 Speech Language Pathology Evaluation Patient Details Name: Breanna Nash MRN: [id number] DOB: [birth year] Today's Date: 06/04/2023 Time: 8592-8572 SLP Time Calculation (min) (ACUTE ONLY): 20 min  Problem List:  Patient Active Problem List   Diagnosis Date Noted   Acute CVA (cerebrovascular accident) (HCC) 06/03/2023   Past Medical History:  Past Medical History:  Diagnosis Date   CAD (coronary artery disease)    CVD (cerebrovascular disease)    Diabetes mellitus type 2 with complications (HCC)    HTN (hypertension)    TIA (transient ischemic attack)    Past Surgical History: No past surgical history on file. HPI:  83 year old female with history of diabetes mellitus type 2 and hypertension was brought to the ER after patient had persistent dizziness and started having left-sided weakness. CT head is unremarkable. MRI No acute intracranial abnormality, punctate focus of hyperintense signal on diffusion-weighted imaging in the right frontal lobe without a correlate on the ADC map. This could represent a subacute infarct.   Assessment / Plan / Recommendation Clinical Impression  Pt lives with spouse and was independent prior to admission. She reported a gradual decline in memory. There was questionable trace left facial weakness with ROM but appeared functional and speech is 100% intelligible. She was able to comprehend multistep commands and express herself in conversation without difficulty. She was given the Cognistat assessment and scored within average range for all subtests evaluated. Given self report of decreased memory, therapist provided several strategies she can utilize when/if needed. Pt and husband agree with not needing ST intervention at this time.    SLP Assessment  SLP Recommendation/Assessment: Patient does not need any further Speech Lanaguage Pathology Services SLP Visit Diagnosis: Cognitive communication deficit (R41.841)    Recommendations for follow up therapy  are one component of a multi-disciplinary discharge planning process, led by the attending physician.  Recommendations may be updated based on patient status, additional functional criteria and insurance authorization.    Follow Up Recommendations  No SLP follow up    Assistance Recommended at Discharge  None  Functional Status Assessment Patient has not had a recent decline in their functional status  Frequency and Duration           SLP Evaluation Cognition  Overall Cognitive Status: Within Functional Limits for tasks assessed Arousal/Alertness: Awake/alert Orientation Level: Oriented X4 Year: 2025 Month: January Day of Week: Correct Attention: Sustained Sustained Attention: Appears intact Memory: Appears intact (4/4 word recall independently) Awareness: Appears intact Problem Solving: Appears intact Safety/Judgment: Appears intact       Comprehension  Auditory Comprehension Overall Auditory Comprehension: Appears within functional limits for tasks assessed Commands: Within Functional Limits Visual Recognition/Discrimination Discrimination: Not tested Reading Comprehension Reading Status: Not tested    Expression Expression Primary Mode of Expression: Verbal Verbal Expression Overall Verbal Expression: Appears within functional limits for tasks assessed Initiation: No impairment Level of Generative/Spontaneous Verbalization: Conversation Repetition: No impairment Naming: No impairment Pragmatics: No impairment Written Expression Written Expression: Not tested   Oral / Motor  Oral Motor/Sensory Function Overall Oral Motor/Sensory Function:  (questionable trace weakness on left facial) Motor Speech Overall Motor Speech: Appears within functional limits for tasks assessed Respiration: Within functional limits Phonation: Normal Resonance: Within functional limits Articulation: Within functional limitis Intelligibility: Intelligible Motor Planning: Witnin  functional limits Motor Speech Errors: Not applicable            Dustin Olam Bull 06/04/2023, 2:48 PM

## 2023-06-04 NOTE — ED Notes (Signed)
 [facility] called for transport.

## 2023-06-04 NOTE — Progress Notes (Signed)
 Patient arrives from [facility]. PT is alert, oriented x4, ambulatory with assistance .  Family is at bedside.PT has dizziness when gets up . Patient had vomited Zofran was given [facility] at [phone]. Skin is intact.
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
## 2023-06-04 NOTE — Progress Notes (Addendum)
   06/04/23 1617  TOC Brief Assessment  Insurance and Status Reviewed (Medicare A and B)  Patient has primary care physician Yes (Haimes, Alm HERO, MD)  Home environment has been reviewed From Home with Husband   Stated stairs a re a bit of a challenge - 2 flights of 7   Prior level of function: independent  Prior/Current Home Services No current home services  Social Drivers of Health Review SDOH reviewed no interventions necessary  Readmission risk has been reviewed Yes (N/A)  Transition of care needs no transition of care needs at this time   Patient from Home with Husband. Never had HH before. TOC will continue to follow patient for any additional discharge needs Husband will transport home at DC.  May want a cane or walker
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
## 2023-06-05 ENCOUNTER — Encounter (HOSPITAL_COMMUNITY): Payer: Self-pay | Admitting: Internal Medicine

## 2023-06-05 ENCOUNTER — Other Ambulatory Visit (HOSPITAL_COMMUNITY): Payer: Self-pay

## 2023-06-05 DIAGNOSIS — I639 Cerebral infarction, unspecified: Secondary | ICD-10-CM

## 2023-06-05 DIAGNOSIS — R2990 Unspecified symptoms and signs involving the nervous system: Secondary | ICD-10-CM | POA: Diagnosis not present

## 2023-06-05 DIAGNOSIS — R29818 Other symptoms and signs involving the nervous system: Secondary | ICD-10-CM | POA: Diagnosis not present

## 2023-06-05 LAB — GLUCOSE, CAPILLARY
Glucose-Capillary: 138 mg/dL — ABNORMAL HIGH (ref 70–99)
Glucose-Capillary: 127 mg/dL — ABNORMAL HIGH (ref 70–99)
Glucose-Capillary: 113 mg/dL — ABNORMAL HIGH (ref 70–99)

## 2023-06-05 LAB — HEMOGLOBIN A1C
Hgb A1c MFr Bld: 6.4 % — ABNORMAL HIGH (ref 4.8–5.6)
Mean Plasma Glucose: 137 mg/dL

## 2023-06-05 MED ORDER — EZETIMIBE 10 MG PO TABS
10.0000 mg | ORAL_TABLET | Freq: Every day | ORAL | Status: DC
Start: 2023-06-05 — End: 2023-06-05
  Administered 2023-06-05: 10 mg via ORAL
  Filled 2023-06-05: qty 1

## 2023-06-05 MED ORDER — LOSARTAN POTASSIUM-HCTZ 100-12.5 MG PO TABS: 1.0000 | ORAL_TABLET | Freq: Every day | ORAL | Status: AC

## 2023-06-05 MED ORDER — ATENOLOL 50 MG PO TABS: 50.0000 mg | ORAL_TABLET | Freq: Every day | ORAL | Status: AC

## 2023-06-05 MED ORDER — AMLODIPINE BESYLATE 10 MG PO TABS: 10.0000 mg | ORAL_TABLET | Freq: Every day | ORAL | Status: AC

## 2023-06-05 MED ORDER — ASPIRIN 81 MG PO TBEC
81.0000 mg | DELAYED_RELEASE_TABLET | Freq: Every day | ORAL | 0 refills | Status: AC
  Filled 2023-06-05: qty 21, 21d supply, fill #0

## 2023-06-05 MED ORDER — EZETIMIBE 10 MG PO TABS
10.0000 mg | ORAL_TABLET | Freq: Every day | ORAL | 0 refills | Status: AC
  Filled 2023-06-05: qty 30, 30d supply, fill #0

## 2023-06-05 MED ORDER — PANTOPRAZOLE SODIUM 40 MG PO TBEC
40.0000 mg | DELAYED_RELEASE_TABLET | Freq: Every day | ORAL | 0 refills | Status: AC
  Filled 2023-06-05: qty 90, 90d supply, fill #0

## 2023-06-05 MED ORDER — CLOPIDOGREL BISULFATE 75 MG PO TABS
75.0000 mg | ORAL_TABLET | Freq: Every day | ORAL | 0 refills | Status: AC
  Filled 2023-06-05: qty 90, 90d supply, fill #0

## 2023-06-05 NOTE — Discharge Instructions (Signed)
 Follow with Primary MD Maritza Alm HERO, MD in 7 days   Get CBC, CMP,  checked  by Primary MD next visit.    Activity: As tolerated with Full fall precautions use walker/cane & assistance as needed   Disposition Home    Diet: Heart Healthy    On your next visit with your primary care physician please Get Medicines reviewed and adjusted.   Please request your Prim.MD to go over all Hospital Tests and Procedure/Radiological results at the follow up, please get all Hospital records sent to your Prim MD by signing hospital release before you go home.   If you experience worsening of your admission symptoms, develop shortness of breath, life threatening emergency, suicidal or homicidal thoughts you must seek medical attention immediately by calling 911 or calling your MD immediately  if symptoms less severe.  You Must read complete instructions/literature along with all the possible adverse reactions/side effects for all the Medicines you take and that have been prescribed to you. Take any new Medicines after you have completely understood and accpet all the possible adverse reactions/side effects.   Do not drive, operating heavy machinery, perform activities at heights, swimming or participation in water activities or provide baby sitting services if your were admitted for syncope or siezures until you have seen by Primary MD or a Neurologist and advised to do so again.  Do not drive when taking Pain medications.    Do not take more than prescribed Pain, Sleep and Anxiety Medications  Special Instructions: If you have smoked or chewed Tobacco  in the last 2 yrs please stop smoking, stop any regular Alcohol  and or any Recreational drug use.  Wear Seat belts while driving.   Please note  You were cared for by a hospitalist during your hospital stay. If you have any questions about your discharge medications or the care you received while you were in the hospital after you are  discharged, you can call the unit and asked to speak with the hospitalist on call if the hospitalist that took care of you is not available. Once you are discharged, your primary care physician will handle any further medical issues. Please note that NO REFILLS for any discharge medications will be authorized once you are discharged, as it is imperative that you return to your primary care physician (or establish a relationship with a primary care physician if you do not have one) for your aftercare needs so that they can reassess your need for medications and monitor your lab values.

## 2023-06-05 NOTE — TOC Transition Note (Addendum)
 Transition of Care [facility]) - Discharge Note   Patient Details  Name: Breanna Nash MRN: [id number] Date of Birth: 06/12/1940  [facility] CM/SW Contact:  Hendricks KANDICE Her, RN Phone Number: 06/05/2023, 10:59 AM   Clinical Narrative:     Update: 11:49 AM PT has recommended a rolling walker and [employer] will deliver prior to discharge   Patient will need HH PT and OT. [facility] will provide services. Patient is requesting either a cane or a RW. RNCM waiting for therapy to make recommendation on what is best for patient  AVS updated           Patient Goals and CMS Choice            Discharge Placement                       Discharge Plan and Services Additional resources added to the After Visit Summary for                                       Social Drivers of Health (SDOH) Interventions SDOH Screenings   Food Insecurity: No Food Insecurity (06/04/2023)  Housing: Unknown (06/04/2023)  Transportation Needs: Unmet Transportation Needs (06/04/2023)  Utilities: Low Risk  (09/27/2022)   Received from [facility]  Social Connections: Socially Integrated (06/04/2023)  Tobacco Use: Low Risk  (06/03/2023)     Readmission Risk Interventions     No data to display

## 2023-06-05 NOTE — Care Management Obs Status (Signed)
 MEDICARE OBSERVATION STATUS NOTIFICATION   Patient Details  Name: Breanna Nash MRN: [id number] Date of Birth: 10/25/1940   Medicare Observation Status Notification Given:  Yes    Gordy Clement, RN 06/05/2023, 11:49 AM

## 2023-06-05 NOTE — Progress Notes (Addendum)
 Patient is alert, awake, ambulatory  with assistance. PT is resting quietly , call light within reach.  900 mls urine out put.

## 2023-06-05 NOTE — Discharge Summary (Signed)
 Physician Discharge Summary  Jeana Kersting FMW:[id number] DOB: 1940/09/18 DOA: 06/03/2023  PCP: Maritza Alm HERO, MD  Admit date: 06/03/2023 Discharge date: 06/05/2023  Admitted From: (Home) Disposition:  (Home )  Recommendations for Outpatient Follow-up:  Follow up with PCP in 1-2 weeks Please obtain BMP/CBC in one week Please follow up on the following pending results:  Home Health: (YES)    Diet recommendation: Heart Healthy / Carb Modified   Brief/Interim Summary:  Breanna Nash is a 83 y.o. female with medical history significant of HTN, HLD, CAD, TIA, diabetes mellitus type 2, GERD, and obesity who presented to the hospital following an episode of dizziness and near syncope. The episode occurred in the afternoon while the patient was near a table, causing her to fall against the table but not to the floor. The patient did not hit her head, lose consciousness, and was able to catch herself. She reported feeling a weird and an inability to stop falling.  She did report left-sided numbness and weakness as well, she had an MRI of the brain which did show punctate stroke in the right frontal lobe.     Acute CVA -MRI brain showing punctate acute infarct in the right frontal lobe - Neurology input greatly appreciated, this is felt secondary to small vessel disease, patient on full dose aspirin  at home, recommendation for dual antiplatelet therapy aspirin  and Plavix  x 3 weeks, then Plavix  alone . acute abnormality. Small vessel disease. ASPECTS 10.    -CTA head & neck no LVO, moderate stenosis of the origin of the left vertebral artery, narrowing of right P2 segment - MRI punctate acute infarct in the right frontal lobe -2D Echo EF 60 to 65%, grade 1 diastolic dysfunction, normal left atrial size, interatrial septum not well-visualized - LDL 93 will add Zetia  -HgbA1c 6.4 -PT, OT input greatly appreciated, will arrange for home health  Hyperlipidemia - LDL is 93, continue  with atorvastatin  80 mg daily, will add Zetia    Diabetes mellitus, type II, well-controlled with A1c of 6.4 -resume home regimen on discharge  Hypertension -Allow for permissive hypertension, instructed to resume home meds in a gradual fashion over the next 72 hours   hypokalemia- Repleted  Obesity class I Body mass index is 32.02 kg/m.   Discharge Diagnoses:  Principal Problem:   Stroke-like symptoms Active Problems:   Hypertensive urgency   Palpitations   Hypokalemia   Controlled diabetes mellitus type 2 with complications (HCC)   Hyperlipidemia   Obesity (BMI 30-39.9)    Discharge Instructions  Discharge Instructions     Ambulatory referral to Neurology   Complete by: As directed    Follow up with stroke clinic NP at [facility] in about 4-6 weeks. Thanks.   Diet - low sodium heart healthy   Complete by: As directed    Discharge instructions   Complete by: As directed    Follow with Primary MD Maritza Alm HERO, MD in 7 days   Get CBC, CMP,  checked  by Primary MD next visit.    Activity: As tolerated with Full fall precautions use walker/cane & assistance as needed   Disposition Home    Diet: Heart Healthy/ Carb modified   On your next visit with your primary care physician please Get Medicines reviewed and adjusted.   Please request your Prim.MD to go over all Hospital Tests and Procedure/Radiological results at the follow up, please get all Hospital records sent to your Prim MD by signing hospital release before you go home.  If you experience worsening of your admission symptoms, develop shortness of breath, life threatening emergency, suicidal or homicidal thoughts you must seek medical attention immediately by calling 911 or calling your MD immediately  if symptoms less severe.  You Must read complete instructions/literature along with all the possible adverse reactions/side effects for all the Medicines you take and that have been prescribed to you. Take  any new Medicines after you have completely understood and accpet all the possible adverse reactions/side effects.   Do not drive, operating heavy machinery, perform activities at heights, swimming or participation in water activities or provide baby sitting services if your were admitted for syncope or siezures until you have seen by Primary MD or a Neurologist and advised to do so again.  Do not drive when taking Pain medications.    Do not take more than prescribed Pain, Sleep and Anxiety Medications  Special Instructions: If you have smoked or chewed Tobacco  in the last 2 yrs please stop smoking, stop any regular Alcohol  and or any Recreational drug use.  Wear Seat belts while driving.   Please note  You were cared for by a hospitalist during your hospital stay. If you have any questions about your discharge medications or the care you received while you were in the hospital after you are discharged, you can call the unit and asked to speak with the hospitalist on call if the hospitalist that took care of you is not available. Once you are discharged, your primary care physician will handle any further medical issues. Please note that NO REFILLS for any discharge medications will be authorized once you are discharged, as it is imperative that you return to your primary care physician (or establish a relationship with a primary care physician if you do not have one) for your aftercare needs so that they can reassess your need for medications and monitor your lab values.   Increase activity slowly   Complete by: As directed       Allergies as of 06/05/2023       Reactions   Latex Dermatitis, Itching   Leave red itch places Other Reaction(s): Dermatitis, Other Leaves red patch Leave red itch places  Leave red itch places   Leave red itch places   Leaves red patch   Penicillins Rash, Swelling   Oxycodone Nausea And Vomiting   Other Reaction(s): GI Intolerance   Percocet  [oxycodone-acetaminophen ] Nausea And Vomiting   Oxycodone-acetaminophen  Nausea And Vomiting   Penicillin G Rash        Medication List     STOP taking these medications    famotidine 40 MG tablet Commonly known as: PEPCID   ibuprofen 200 MG tablet Commonly known as: ADVIL   naproxen 500 MG tablet Commonly known as: NAPROSYN       TAKE these medications    albuterol  108 (90 Base) MCG/ACT inhaler Commonly known as: VENTOLIN  HFA Inhale 2 puffs into the lungs every 6 (six) hours as needed for wheezing or shortness of breath.   amLODipine  10 MG tablet Commonly known as: NORVASC  Take 1 tablet (10 mg total) by mouth daily. Start taking on: June 07, 2023 What changed: These instructions start on June 07, 2023. If you are unsure what to do until then, ask your doctor or other care provider.   aspirin  EC 81 MG tablet Take 1 tablet (81 mg total) by mouth daily for 21 days. Swallow whole. Start taking on: June 06, 2023   atenolol  50  MG tablet Commonly known as: TENORMIN  Take 1 tablet (50 mg total) by mouth daily. Start taking on: June 09, 2023 What changed: These instructions start on June 09, 2023. If you are unsure what to do until then, ask your doctor or other care provider.   atorvastatin  80 MG tablet Commonly known as: LIPITOR  Take 1 tablet by mouth daily.   clopidogrel  75 MG tablet Commonly known as: PLAVIX  Take 1 tablet (75 mg total) by mouth daily. Start taking on: June 06, 2023   cyclobenzaprine 10 MG tablet Commonly known as: FLEXERIL Take 10 mg by mouth 2 (two) times daily as needed for muscle spasms.   ezetimibe  10 MG tablet Commonly known as: ZETIA  Take 1 tablet (10 mg total) by mouth daily.   insulin  degludec 100 UNIT/ML FlexTouch Pen Commonly known as: TRESIBA Inject 20 Units into the skin at bedtime. May increase 2 units every 3 days if sugars aren't at goal <130. Max dose is 30 units.   losartan -hydrochlorothiazide 100-12.5 MG  tablet Commonly known as: HYZAAR Take 1 tablet by mouth daily. Start taking on: June 08, 2023 What changed: These instructions start on June 08, 2023. If you are unsure what to do until then, ask your doctor or other care provider.   metFORMIN 500 MG tablet Commonly known as: GLUCOPHAGE Take 500 mg by mouth daily with breakfast.   pantoprazole  40 MG tablet Commonly known as: Protonix  Take 1 tablet (40 mg total) by mouth daily.        Follow-up Information     [facility], [facility] Follow up.   Specialty: Home Health Services Why: Hedda will contact you within 48 hours of DC to arrange a home visit Contact information: [address] [place] [address] [phone]         [facility]. Schedule an appointment as soon as possible for a visit in 1 month(s).   Specialty: Neurology Why: stroke clinic Contact information: [address] [place]  [address] [phone]               Allergies  Allergen Reactions   Latex Dermatitis and Itching    Leave red itch places  Other Reaction(s): Dermatitis, Other  Leaves red patch  Leave red itch places   Leave red itch places   Leave red itch places   Leaves red patch   Penicillins Rash and Swelling   Oxycodone Nausea And Vomiting    Other Reaction(s): GI Intolerance   Percocet [Oxycodone-Acetaminophen ] Nausea And Vomiting   Oxycodone-Acetaminophen  Nausea And Vomiting   Penicillin G Rash    Consultations: Neurology   Procedures/Studies: ECHOCARDIOGRAM COMPLETE Result Date: 06/04/2023    ECHOCARDIOGRAM REPORT   Patient Name:   Carolynn Mcree Date of Exam: 06/04/2023 Medical Rec #:  [id number]          Height:       62.0 in Accession #:    [id number]         Weight:       175.0 lb Date of Birth:  01-25-1941         BSA:          1.806 m Patient Age:    82 years           BP:           135/71 mmHg Patient Gender: F  HR:           75 bpm. Exam Location:  Inpatient Procedure: 2D Echo, Cardiac Doppler and Color Doppler Indications:   Stoke  History:       Patient has no prior history of Echocardiogram examinations.                Stroke.  Sonographer:   Carmelita Hartshorn RDCS, FE, PE Referring      [id number] RONDELL A SMITH Phys: IMPRESSIONS  1. Left ventricular ejection fraction, by estimation, is 60 to 65%. The left ventricle has normal function. The left ventricle has no regional wall motion abnormalities. Left ventricular diastolic parameters are consistent with Grade I diastolic dysfunction (impaired relaxation).  2. Right ventricular systolic function is normal. The right ventricular size is normal. There is normal pulmonary artery systolic pressure.  3. The mitral valve is normal in structure. No evidence of mitral valve regurgitation.  4. The aortic valve is tricuspid. Aortic valve regurgitation is mild.  5. The inferior vena cava is normal in size with greater than 50% respiratory variability, suggesting right atrial pressure of 3 mmHg. FINDINGS  Left Ventricle: Left ventricular ejection fraction, by estimation, is 60 to 65%. The left ventricle has normal function. The left ventricle has no regional wall motion abnormalities. The left ventricular internal cavity size was normal in size. There is  no left ventricular hypertrophy. Left ventricular diastolic parameters are consistent with Grade I diastolic dysfunction (impaired relaxation). Right Ventricle: The right ventricular size is normal. Right ventricular systolic function is normal. There is normal pulmonary artery systolic pressure. The tricuspid regurgitant velocity is 2.12 m/s, and with an assumed right atrial pressure of 3 mmHg,  the estimated right ventricular systolic pressure is 21.0 mmHg. Left Atrium: Left atrial size was normal in size. Right Atrium: Right atrial size was normal in size. Pericardium: There is no evidence of pericardial effusion. Mitral Valve: The  mitral valve is normal in structure. No evidence of mitral valve regurgitation. Tricuspid Valve: Tricuspid valve regurgitation is trivial. Aortic Valve: The aortic valve is tricuspid. Aortic valve regurgitation is mild. Aortic regurgitation PHT measures 594 msec. Aortic valve mean gradient measures 5.0 mmHg. Aortic valve peak gradient measures 9.2 mmHg. Aortic valve area, by VTI measures 2.08 cm. Pulmonic Valve: Pulmonic valve regurgitation is not visualized. Aorta: The aortic root and ascending aorta are structurally normal, with no evidence of dilitation. Venous: The inferior vena cava is normal in size with greater than 50% respiratory variability, suggesting right atrial pressure of 3 mmHg. IAS/Shunts: The interatrial septum was not well visualized.  LEFT VENTRICLE PLAX 2D LVIDd:         3.20 cm   Diastology LVIDs:         2.10 cm   LV e' medial:    5.55 cm/s LV PW:         1.00 cm   LV E/e' medial:  10.0 LV IVS:        1.00 cm   LV e' lateral:   7.72 cm/s LVOT diam:     1.80 cm   LV E/e' lateral: 7.2 LV SV:         64 LV SV Index:   35 LVOT Area:     2.54 cm  RIGHT VENTRICLE RV S prime:     10.20 cm/s TAPSE (M-mode): 1.8 cm LEFT ATRIUM             Index        RIGHT ATRIUM  Index LA diam:        3.40 cm 1.88 cm/m   RA Area:     9.12 cm LA Vol (A2C):   24.6 ml 13.62 ml/m  RA Volume:   15.70 ml 8.69 ml/m LA Vol (A4C):   35.9 ml 19.87 ml/m LA Biplane Vol: 29.8 ml 16.50 ml/m  AORTIC VALVE AV Area (Vmax):    2.13 cm AV Area (Vmean):   2.02 cm AV Area (VTI):     2.08 cm AV Vmax:           152.00 cm/s AV Vmean:          107.000 cm/s AV VTI:            0.307 m AV Peak Grad:      9.2 mmHg AV Mean Grad:      5.0 mmHg LVOT Vmax:         127.00 cm/s LVOT Vmean:        85.100 cm/s LVOT VTI:          0.251 m LVOT/AV VTI ratio: 0.82 AI PHT:            594 msec  AORTA Ao Root diam: 2.90 cm Ao Asc diam:  2.90 cm MITRAL VALVE                TRICUSPID VALVE MV Area (PHT): 2.62 cm     TR Peak grad:   18.0 mmHg  MV Decel Time: 290 msec     TR Vmax:        212.00 cm/s MV E velocity: 55.70 cm/s MV A velocity: 115.00 cm/s  SHUNTS MV E/A ratio:  0.48         Systemic VTI:  0.25 m                             Systemic Diam: 1.80 cm Ronal Ross Electronically signed by Ronal Ross Signature Date/Time: 06/04/2023/1:44:08 PM    Final    MR BRAIN WO CONTRAST Result Date: 06/04/2023 CLINICAL DATA:  Stroke, follow up EXAM: MRI HEAD WITHOUT CONTRAST TECHNIQUE: Multiplanar, multiecho pulse sequences of the brain and surrounding structures were obtained without intravenous contrast. COMPARISON:  CTA head/neck 06/03/23 FINDINGS: Brain: Negative for an acute infarct. No hemorrhage. No hydrocephalus. No extra-axial fluid collection. There is a punctate focus of hyperintense signal on diffusion-weighted imaging in the right frontal lobe (series 2, image 24) without a correlate on the ADC map. This could represent a subacute infarct. There is a background of mild-to-moderate chronic microvascular ischemic change. Enlarged and partially empty sella. Vascular: Normal flow voids Skull and upper cervical spine: Normal marrow signal. Sinuses/Orbits: No middle ear or mastoid effusion. Paranasal sinuses are clear. Bilateral lens replacement. Orbits are otherwise unremarkable. Other: None IMPRESSION: 1.  No acute intracranial abnormality. 2. Punctate focus of hyperintense signal on diffusion-weighted imaging in the right frontal lobe without a correlate on the ADC map. This could represent a subacute infarct. Electronically Signed   By: Lyndall Gore M.D.   On: 06/04/2023 10:28   CT ANGIO HEAD NECK W WO CM Result Date: 06/04/2023 CLINICAL DATA:  Initial evaluation for acute neuro deficit, stroke suspected. EXAM: CT ANGIOGRAPHY HEAD AND NECK WITH AND WITHOUT CONTRAST TECHNIQUE: Multidetector CT imaging of the head and neck was performed using the standard protocol during bolus administration of intravenous contrast. Multiplanar CT image reconstructions  and MIPs were obtained to evaluate the vascular  anatomy. Carotid stenosis measurements (when applicable) are obtained utilizing NASCET criteria, using the distal internal carotid diameter as the denominator. RADIATION DOSE REDUCTION: This exam was performed according to the departmental dose-optimization program which includes automated exposure control, adjustment of the mA and/or kV according to patient size and/or use of iterative reconstruction technique. CONTRAST:  75mL OMNIPAQUE  IOHEXOL  350 MG/ML SOLN COMPARISON:  Prior CT from earlier the same day. FINDINGS: CTA NECK FINDINGS Aortic arch: Visualized aortic arch within normal limits for caliber. Bovine branching pattern noted. No stenosis about the origin the great vessels. Right carotid system: Right common and internal carotid arteries are patent without dissection. Mild atheromatous change about the right carotid bulb without stenosis. Left carotid system: Left common and internal carotid arteries are patent without dissection. Mild atheromatous change about the left carotid bulb without stenosis. Vertebral arteries: Both vertebral arteries arise from subclavian arteries. No proximal subclavian artery stenosis. Atheromatous plaque at the origin of the left vertebral artery with moderate stenosis. Vertebral arteries otherwise patent without stenosis or dissection. Skeleton: No discrete or worrisome osseous lesions. Moderate cervical spondylosis. Torus palatini noted. Other neck: No other acute finding. Upper chest: No other acute finding. Review of the MIP images confirms the above findings CTA HEAD FINDINGS Anterior circulation: Atheromatous irregularity about the carotid siphons bilaterally without hemodynamically significant stenosis. A1 segments patent bilaterally. Left A1 hypoplastic. Normal anterior communicating artery complex. Anterior cerebral arteries patent without stenosis. No M1 stenosis or occlusion. Distal MCA branches perfused and symmetric.  Posterior circulation: Both V4 segments patent without significant stenosis. Right PICA patent. Left PICA origin not well seen. Basilar widely patent without stenosis. Superior cerebral arteries patent bilaterally. Both PCAs primarily supplied via the basilar. Atheromatous irregularity about the PCAs bilaterally with associated moderate diffuse narrowing of the right P2 segment (series 606, image 20). No significant stenosis about the left PCA. Venous sinuses: Grossly patent allowing for timing the contrast bolus. Anatomic variants: As above.  No aneurysm. Review of the MIP images confirms the above findings IMPRESSION: 1. Negative CTA for large vessel occlusion or other emergent finding. 2. Mild for age atheromatous change about the carotid bifurcations and carotid siphons without hemodynamically significant or correctable stenosis. 3. Atheromatous plaque at the origin of the left vertebral artery with associated moderate stenosis. 4. Atheromatous irregularity about the PCAs bilaterally with associated moderate diffuse narrowing of the right P2 segment. Electronically Signed   By: Morene Hoard M.D.   On: 06/04/2023 01:04   CT HEAD CODE STROKE WO CONTRAST` Result Date: 06/03/2023 CLINICAL DATA:  Code stroke. Initial evaluation for acute left upper extremity weakness. EXAM: CT HEAD WITHOUT CONTRAST TECHNIQUE: Contiguous axial images were obtained from the base of the skull through the vertex without intravenous contrast. RADIATION DOSE REDUCTION: This exam was performed according to the departmental dose-optimization program which includes automated exposure control, adjustment of the mA and/or kV according to patient size and/or use of iterative reconstruction technique. COMPARISON:  Prior study from 12/07/2016. FINDINGS: Brain: Cerebral volume within normal limits. Patchy hypodensity involving the supratentorial cerebral white matter, consistent with chronic small vessel ischemic disease, similar to  prior. No acute intracranial hemorrhage. No acute large vessel territory infarct. No mass lesion or midline shift. No hydrocephalus or extra-axial fluid collection. Partially empty sella noted. Vascular: No abnormal hyperdense vessel. Skull: Scalp soft tissues and calvarium within normal limits. Sinuses/Orbits: Globes and orbital soft tissues within normal limits. Paranasal sinuses and mastoid air cells are largely clear. Other: None. ASPECTS [facility] Stroke Program  Early CT Score) - Ganglionic level infarction (caudate, lentiform nuclei, internal capsule, insula, M1-M3 cortex): 7 - Supraganglionic infarction (M4-M6 cortex): 3 Total score (0-10 with 10 being normal): 10 IMPRESSION: 1. No acute intracranial abnormality. Aspects is 10. 2. Moderate chronic microvascular ischemic disease, similar to prior. Critical Value/emergent results were called by telephone at the time of interpretation on 06/03/2023 at 10:56 pm to provider ADAM CURATOLO , who verbally acknowledged these results. Electronically Signed   By: Morene Hoard M.D.   On: 06/03/2023 22:56      Subjective: No significant events overnight, reports her symptoms has much improved  Discharge Exam: Vitals:   06/05/23 1300 06/05/23 1400  BP:    Pulse: 77 82  Resp: 15 16  Temp:    SpO2: 94% 96%   Vitals:   06/05/23 1158 06/05/23 1200 06/05/23 1300 06/05/23 1400  BP: (!) 145/88 135/73    Pulse: 95 90 77 82  Resp: 13 14 15 16   Temp: 98.3 F (36.8 C)     TempSrc: Oral     SpO2: 98% 98% 94% 96%  Weight:      Height:        General: Pt is alert, awake, not in acute distress Cardiovascular: RRR, S1/S2 +, no rubs, no gallops Respiratory: CTA bilaterally, no wheezing, no rhonchi Abdominal: Soft, NT, ND, bowel sounds + Extremities: no edema, no cyanosis    The results of significant diagnostics from this hospitalization (including imaging, microbiology, ancillary and laboratory) are listed below for reference.      Microbiology: Recent Results (from the past 240 hours)  Resp panel by RT-PCR (RSV, Flu A&B, Covid) Anterior Nasal Swab     Status: None   Collection Time: 06/04/23 12:09 AM   Specimen: Anterior Nasal Swab  Result Value Ref Range Status   SARS Coronavirus 2 by RT PCR NEGATIVE NEGATIVE Final    Comment: (NOTE) SARS-CoV-2 target nucleic acids are NOT DETECTED.  The SARS-CoV-2 RNA is generally detectable in upper respiratory specimens during the acute phase of infection. The lowest concentration of SARS-CoV-2 viral copies this assay can detect is 138 copies/mL. A negative result does not preclude SARS-Cov-2 infection and should not be used as the sole basis for treatment or other patient management decisions. A negative result may occur with  improper specimen collection/handling, submission of specimen other than nasopharyngeal swab, presence of viral mutation(s) within the areas targeted by this assay, and inadequate number of viral copies(<138 copies/mL). A negative result must be combined with clinical observations, patient history, and epidemiological information. The expected result is Negative.  Fact Sheet for Patients:  bloggercourse.com  Fact Sheet for Healthcare Providers:  seriousbroker.it  This test is no t yet approved or cleared by the [place]  FDA and  has been authorized for detection and/or diagnosis of SARS-CoV-2 by FDA under an Emergency Use Authorization (EUA). This EUA will remain  in effect (meaning this test can be used) for the duration of the COVID-19 declaration under Section 564(b)(1) of the Act, 21 U.S.C.section 360bbb-3(b)(1), unless the authorization is terminated  or revoked sooner.       Influenza A by PCR NEGATIVE NEGATIVE Final   Influenza B by PCR NEGATIVE NEGATIVE Final    Comment: (NOTE) The Xpert Xpress SARS-CoV-2/FLU/RSV plus assay is intended as an aid in the diagnosis of  influenza from Nasopharyngeal swab specimens and should not be used as a sole basis for treatment. Nasal washings and aspirates are unacceptable for Xpert Xpress SARS-CoV-2/FLU/RSV testing.  Fact Sheet for Patients: bloggercourse.com  Fact Sheet for Healthcare Providers: seriousbroker.it  This test is not yet approved or cleared by the [place]  FDA and has been authorized for detection and/or diagnosis of SARS-CoV-2 by FDA under an Emergency Use Authorization (EUA). This EUA will remain in effect (meaning this test can be used) for the duration of the COVID-19 declaration under Section 564(b)(1) of the Act, 21 U.S.C. section 360bbb-3(b)(1), unless the authorization is terminated or revoked.     Resp Syncytial Virus by PCR NEGATIVE NEGATIVE Final    Comment: (NOTE) Fact Sheet for Patients: bloggercourse.com  Fact Sheet for Healthcare Providers: seriousbroker.it  This test is not yet approved or cleared by the [place]  FDA and has been authorized for detection and/or diagnosis of SARS-CoV-2 by FDA under an Emergency Use Authorization (EUA). This EUA will remain in effect (meaning this test can be used) for the duration of the COVID-19 declaration under Section 564(b)(1) of the Act, 21 U.S.C. section 360bbb-3(b)(1), unless the authorization is terminated or revoked.  Performed at [facility], [address] [place], [place] [address]      Labs: BNP (last 3 results) No results for input(s): BNP in the last 8760 hours. Basic Metabolic Panel: Recent Labs  Lab 06/03/23 2225 06/04/23 0758  NA 137  --   K 3.4*  --   CL 100  --   CO2 27  --   GLUCOSE 183*  --   BUN 12  --   CREATININE 0.78  --   CALCIUM  9.8  --   MG  --  1.9   Liver Function Tests: Recent Labs  Lab 06/03/23 2225  AST 30  ALT 32  ALKPHOS 115  BILITOT 0.4  PROT 8.0  ALBUMIN  3.8   No results for input(s): LIPASE, AMYLASE in the last 168 hours. No results for input(s): AMMONIA in the last 168 hours. CBC: Recent Labs  Lab 06/03/23 2225  WBC 6.4  NEUTROABS 3.0  HGB 14.0  HCT 40.2  MCV 87.4  PLT 227   Cardiac Enzymes: No results for input(s): CKTOTAL, CKMB, CKMBINDEX, TROPONINI in the last 168 hours. BNP: Invalid input(s): POCBNP CBG: Recent Labs  Lab 06/04/23 1156 06/04/23 1836 06/04/23 2118 06/05/23 0759 06/05/23 1157  GLUCAP 125* 115* 157* 138* 127*   D-Dimer No results for input(s): DDIMER in the last 72 hours. Hgb A1c Recent Labs    06/04/23 0758  HGBA1C 6.4*   Lipid Profile Recent Labs    06/04/23 0758  CHOL 134  HDL 32*  LDLCALC 93  TRIG 47  CHOLHDL 4.2   Thyroid  function studies Recent Labs    06/04/23 0818  TSH 0.958   Anemia work up No results for input(s): VITAMINB12, FOLATE, FERRITIN, TIBC, IRON, RETICCTPCT in the last 72 hours. Urinalysis    Component Value Date/Time   COLORURINE YELLOW 06/03/2023 0014   APPEARANCEUR CLEAR 06/03/2023 0014   LABSPEC 1.015 06/03/2023 0014   PHURINE 7.0 06/03/2023 0014   GLUCOSEU NEGATIVE 06/03/2023 0014   HGBUR NEGATIVE 06/03/2023 0014   BILIRUBINUR NEGATIVE 06/03/2023 0014   KETONESUR NEGATIVE 06/03/2023 0014   PROTEINUR NEGATIVE 06/03/2023 0014   NITRITE NEGATIVE 06/03/2023 0014   LEUKOCYTESUR NEGATIVE 06/03/2023 0014   Sepsis Labs Recent Labs  Lab 06/03/23 2225  WBC 6.4   Microbiology Recent Results (from the past 240 hours)  Resp panel by RT-PCR (RSV, Flu A&B, Covid) Anterior Nasal Swab     Status: None  Collection Time: 06/04/23 12:09 AM   Specimen: Anterior Nasal Swab  Result Value Ref Range Status   SARS Coronavirus 2 by RT PCR NEGATIVE NEGATIVE Final    Comment: (NOTE) SARS-CoV-2 target nucleic acids are NOT DETECTED.  The SARS-CoV-2 RNA is generally detectable in upper respiratory specimens during the acute phase of  infection. The lowest concentration of SARS-CoV-2 viral copies this assay can detect is 138 copies/mL. A negative result does not preclude SARS-Cov-2 infection and should not be used as the sole basis for treatment or other patient management decisions. A negative result may occur with  improper specimen collection/handling, submission of specimen other than nasopharyngeal swab, presence of viral mutation(s) within the areas targeted by this assay, and inadequate number of viral copies(<138 copies/mL). A negative result must be combined with clinical observations, patient history, and epidemiological information. The expected result is Negative.  Fact Sheet for Patients:  bloggercourse.com  Fact Sheet for Healthcare Providers:  seriousbroker.it  This test is no t yet approved or cleared by the [place]  FDA and  has been authorized for detection and/or diagnosis of SARS-CoV-2 by FDA under an Emergency Use Authorization (EUA). This EUA will remain  in effect (meaning this test can be used) for the duration of the COVID-19 declaration under Section 564(b)(1) of the Act, 21 U.S.C.section 360bbb-3(b)(1), unless the authorization is terminated  or revoked sooner.       Influenza A by PCR NEGATIVE NEGATIVE Final   Influenza B by PCR NEGATIVE NEGATIVE Final    Comment: (NOTE) The Xpert Xpress SARS-CoV-2/FLU/RSV plus assay is intended as an aid in the diagnosis of influenza from Nasopharyngeal swab specimens and should not be used as a sole basis for treatment. Nasal washings and aspirates are unacceptable for Xpert Xpress SARS-CoV-2/FLU/RSV testing.  Fact Sheet for Patients: bloggercourse.com  Fact Sheet for Healthcare Providers: seriousbroker.it  This test is not yet approved or cleared by the [place]  FDA and has been authorized for detection and/or diagnosis of SARS-CoV-2  by FDA under an Emergency Use Authorization (EUA). This EUA will remain in effect (meaning this test can be used) for the duration of the COVID-19 declaration under Section 564(b)(1) of the Act, 21 U.S.C. section 360bbb-3(b)(1), unless the authorization is terminated or revoked.     Resp Syncytial Virus by PCR NEGATIVE NEGATIVE Final    Comment: (NOTE) Fact Sheet for Patients: bloggercourse.com  Fact Sheet for Healthcare Providers: seriousbroker.it  This test is not yet approved or cleared by the [place]  FDA and has been authorized for detection and/or diagnosis of SARS-CoV-2 by FDA under an Emergency Use Authorization (EUA). This EUA will remain in effect (meaning this test can be used) for the duration of the COVID-19 declaration under Section 564(b)(1) of the Act, 21 U.S.C. section 360bbb-3(b)(1), unless the authorization is terminated or revoked.  Performed at [facility], [address] [place], [place] [address]      Time coordinating discharge: Over 30 minutes  SIGNED:   Brayton Lye, MD  [facility] 06/05/2023, 3:08 PM Pager   If 7PM-7AM, please contact night-coverage [email or web address] Password [email or web address]
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
## 2023-06-05 NOTE — Consult Note (Addendum)
 Stroke Team Consultation  Reason for Consult: Acute onset dizziness and left-sided numbness and weakness, punctate stroke in the right frontal lobe Referring Physician: Dr. Katina  CC: Left-sided numbness and weakness  History is obtained from: Patient and chart  HPI: Breanna Nash is a 83 y.o. female with history of hypertension, hyperlipidemia, CAD, TIA, diabetes, GERD and obesity who presents with an acute onset episode of dizziness which caused her to need to sit down abruptly as well as left-sided numbness and weakness.  She was found on MRI to have a punctate stroke in the right frontal lobe   LKW: 1/7 08:00 TNK given?: no, presented outside of window Premorbid modified Rankin scale (mRS):  0-Completely asymptomatic and back to baseline post-stroke  ROS: Full ROS was performed and is negative except as noted in the HPI.   Past Medical History:  Diagnosis Date   CAD (coronary artery disease)    CVD (cerebrovascular disease)    Diabetes mellitus type 2 with complications (HCC)    HTN (hypertension)    TIA (transient ischemic attack)     No family history on file.   Social History:   reports that she has never smoked. She has never used smokeless tobacco. She reports that she does not drink alcohol and does not use drugs.  Medications  Current Facility-Administered Medications:    acetaminophen  (TYLENOL ) tablet 650 mg, 650 mg, Oral, Q4H PRN **OR** acetaminophen  (TYLENOL ) 160 MG/5ML solution 650 mg, 650 mg, Per Tube, Q4H PRN **OR** acetaminophen  (TYLENOL ) suppository 650 mg, 650 mg, Rectal, Q4H PRN, Claudene, Rondell A, MD   aspirin  EC tablet 81 mg, 81 mg, Oral, Daily, Claudene, Rondell A, MD, 81 mg at 06/05/23 9094   atorvastatin  (LIPITOR ) tablet 80 mg, 80 mg, Oral, Daily, Claudene, Rondell A, MD, 80 mg at 06/05/23 9094   clopidogrel  (PLAVIX ) tablet 75 mg, 75 mg, Oral, Daily, Claudene, Rondell A, MD, 75 mg at 06/05/23 9094   enoxaparin  (LOVENOX ) injection 40 mg, 40 mg,  Subcutaneous, Q24H, Smith, Rondell A, MD, 40 mg at 06/05/23 9094   ezetimibe  (ZETIA ) tablet 10 mg, 10 mg, Oral, Daily, Jerri Pfeiffer, MD   hydrALAZINE  (APRESOLINE ) injection 10 mg, 10 mg, Intravenous, Q4H PRN, Claudene, Rondell A, MD   insulin  aspart (novoLOG ) injection 0-9 Units, 0-9 Units, Subcutaneous, TID WC, Claudene, Rondell A, MD, 2 Units at 06/05/23 1238   insulin  glargine-yfgn (SEMGLEE ) injection 10 Units, 10 Units, Subcutaneous, QHS, Smith, Rondell A, MD, 10 Units at 06/04/23 2125   ondansetron  (ZOFRAN ) injection 4 mg, 4 mg, Intravenous, Q6H PRN, Claudene, Rondell A, MD   senna-docusate (Senokot-S) tablet 1 tablet, 1 tablet, Oral, QHS PRN, Claudene Maximino LABOR, MD   Exam: Current vital signs: BP 135/73   Pulse 82   Temp 98.3 F (36.8 C) (Oral)   Resp 16   Ht 5' 2 (1.575 m)   Wt 79.4 kg   SpO2 96%   BMI 32.02 kg/m  Vital signs in last 24 hours: Temp:  [98 F (36.7 C)-98.6 F (37 C)] 98.3 F (36.8 C) (01/09 1158) Pulse Rate:  [77-98] 82 (01/09 1400) Resp:  [13-20] 16 (01/09 1400) BP: (129-160)/(68-88) 135/73 (01/09 1200) SpO2:  [94 %-99 %] 96 % (01/09 1400)  GENERAL: Awake, alert in NAD HEENT: - Normocephalic and atraumatic, moist mm,  LUNGS -Giller unlabored respirations on room air CV -regular rate and rhythm on the monitor Ext: warm, well perfused  NEURO:  Mental Status: AA&Ox3  Language: speech is clear and fluent Cranial Nerves: PERRL  EOMI, visual fields full, no facial asymmetry, facial sensation diminished on the left, hearing intact, tongue/uvula/soft palate midline, normal sternocleidomastoid and trapezius muscle strength. No evidence of tongue atrophy or fibrillations Motor: Able to move all 4 extremities with good antigravity strength Tone: is normal and bulk is normal Sensation- Intact to light touch bilaterally but diminished on the left Coordination: FTN intact bilaterally, no ataxia in BLE. Gait- deferred  1a Level of Conscious.: 0 1b LOC Questions: 0 1c LOC  Commands: 0 2 Best Gaze: 0 3 Visual: 0 4 Facial Palsy: 0 5a Motor Arm - left: 0 5b Motor Arm - Right: 0 6a Motor Leg - Left: 0 6b Motor Leg - Right: 0 7 Limb Ataxia: 0 8 Sensory: 1 9 Best Language: 0 10 Dysarthria: 0  11 Extinct. and Inatten.: 0 TOTAL: 1     Labs I have reviewed labs in epic and the results pertinent to this consultation are:  CBC    Component Value Date/Time   WBC 6.4 06/03/2023 2225   RBC 4.60 06/03/2023 2225   HGB 14.0 06/03/2023 2225   HCT 40.2 06/03/2023 2225   PLT 227 06/03/2023 2225   MCV 87.4 06/03/2023 2225   MCH 30.4 06/03/2023 2225   MCHC 34.8 06/03/2023 2225   RDW 13.7 06/03/2023 2225   LYMPHSABS 2.8 06/03/2023 2225   MONOABS 0.3 06/03/2023 2225   EOSABS 0.2 06/03/2023 2225   BASOSABS 0.0 06/03/2023 2225    CMP     Component Value Date/Time   NA 137 06/03/2023 2225   K 3.4 (L) 06/03/2023 2225   CL 100 06/03/2023 2225   CO2 27 06/03/2023 2225   GLUCOSE 183 (H) 06/03/2023 2225   BUN 12 06/03/2023 2225   CREATININE 0.78 06/03/2023 2225   CALCIUM  9.8 06/03/2023 2225   PROT 8.0 06/03/2023 2225   ALBUMIN 3.8 06/03/2023 2225   AST 30 06/03/2023 2225   ALT 32 06/03/2023 2225   ALKPHOS 115 06/03/2023 2225   BILITOT 0.4 06/03/2023 2225   GFRNONAA >60 06/03/2023 2225   GFRAA >60 08/23/2016 0937    Lipid Panel     Component Value Date/Time   CHOL 134 06/04/2023 0758   TRIG 47 06/04/2023 0758   HDL 32 (L) 06/04/2023 0758   CHOLHDL 4.2 06/04/2023 0758   VLDL 9 06/04/2023 0758   LDLCALC 93 06/04/2023 0758    Assessment and plan  Stroke: Punctate acute infarct in the right juxtacortical frontal lobe, etiology: Likely small vessel disease Code Stroke CT head No acute abnormality. Small vessel disease. ASPECTS 10.    CTA head & neck no LVO, moderate stenosis of the origin of the left vertebral artery, narrowing of right P2 segment MRI punctate acute infarct in the right frontal lobe 2D Echo EF 60 to 65% LDL 93 UDS  negative HgbA1c 6.4 VTE prophylaxis -Lovenox  ASA 325 daily prior to admission, now on aspirin  81 mg daily and clopidogrel  75 mg daily for 3 weeks followed by Plavix  alone Therapy recommendations: No PT/OT follow-up needed Disposition: Home  History of TIA Patient reported similar symptoms in 2016, admitted to [facility] for slurred speech, dizziness, imbalance.  Symptoms lasted about 20 minutes, diagnosed with TIA.  MRI negative for acute stroke.  CT head and neck concerning for left PCA high-grade stenosis and left VA origin stenosis. Referred to [facility] Dr. Dolphus for cerebral angiogram showed left PCA P1/P2 75% stenosis and 50% right P1/P2 stenosis.  No intervention needed at that time. Patient was  put on aspirin  325 and Lipitor  80 since.  Hypertension Home meds: Amlodipine  10 mg daily, atenolol  50 mg daily, losartan -hydrochlorothiazide 100-12.5 mg daily Stable Long-term BP goal normotensive  Hyperlipidemia Home meds: Atorvastatin  80 mg daily LDL 93, goal < 70 Add ezetimibe  10 mg daily Continue Lipitor  80 Continue statin and Zetia  at discharge  Diabetes type II Controlled Home meds: Metformin 500 mg daily, insulin  degludec 20 units daily HgbA1c 6.4, goal < 7.0 CBGs SSI Close PCP follow-up for DM control  Other Stroke Risk Factors Advanced Age >/= 90  Obesity, Body mass index is 32.02 kg/m., BMI >/= 30 associated with increased stroke risk, recommend weight loss, diet and exercise as appropriate   Other Active Problems None  Hospital day # 0   Patient seen and examined by NP/APP with MD. MD to update note as needed.   Cortney E Everitt Clint Kill , MSN, AGACNP-BC [facility] See Amion for schedule and pager information 06/05/2023 3:01 PM   ATTENDING NOTE: I reviewed above note and agree with the assessment and plan. Pt was seen and examined.   Darden is at the bedside.  Patient lying bed, AOx3, no aphasia, fluent language, follows some  commands, able to name and repeat.  Cranial nerve exam intact, bilateral muscle strength symmetrical, left upper and lower extremity mildly decreased light touch sensation compared to the right.  No ataxia.  Patient still likely due to small vessel disease.  She had a history of TIA in 2016, on aspirin  325 and Lipitor  80 since.  Recommend aspirin  81 and Plavix  75 DAPT for 3 weeks and then Plavix  alone continue home Lipitor  80.  Add Zetia  10.  PT and OT no recommendation.  For detailed assessment and plan, please refer to above/below as I have made changes wherever appropriate.   Neurology will sign off. Please call with questions. Pt will follow up with stroke clinic NP at [facility] in about 4 weeks. Thanks for the consult.   Ary Cummins, MD PhD Stroke Neurology 06/05/2023 4:50 PM

## 2023-06-17 NOTE — Progress Notes (Signed)
ok 

## 2023-06-28 ENCOUNTER — Encounter (HOSPITAL_BASED_OUTPATIENT_CLINIC_OR_DEPARTMENT_OTHER): Payer: Self-pay | Admitting: Emergency Medicine

## 2023-06-28 ENCOUNTER — Emergency Department (HOSPITAL_BASED_OUTPATIENT_CLINIC_OR_DEPARTMENT_OTHER)
Admission: EM | Admit: 2023-06-28 | Discharge: 2023-06-28 | Disposition: A | Discharge status: Home / Self Care | Payer: Medicare Other

## 2023-06-28 DIAGNOSIS — Z7984 Long term (current) use of oral hypoglycemic drugs: Secondary | ICD-10-CM | POA: Insufficient documentation

## 2023-06-28 DIAGNOSIS — J029 Acute pharyngitis, unspecified: Secondary | ICD-10-CM | POA: Diagnosis present

## 2023-06-28 DIAGNOSIS — J101 Influenza due to other identified influenza virus with other respiratory manifestations: Secondary | ICD-10-CM

## 2023-06-28 DIAGNOSIS — I2510 Atherosclerotic heart disease of native coronary artery without angina pectoris: Secondary | ICD-10-CM | POA: Diagnosis not present

## 2023-06-28 DIAGNOSIS — E119 Type 2 diabetes mellitus without complications: Secondary | ICD-10-CM | POA: Diagnosis not present

## 2023-06-28 DIAGNOSIS — I10 Essential (primary) hypertension: Secondary | ICD-10-CM | POA: Insufficient documentation

## 2023-06-28 DIAGNOSIS — J09X2 Influenza due to identified novel influenza A virus with other respiratory manifestations: Secondary | ICD-10-CM | POA: Insufficient documentation

## 2023-06-28 DIAGNOSIS — Z794 Long term (current) use of insulin: Secondary | ICD-10-CM | POA: Diagnosis not present

## 2023-06-28 DIAGNOSIS — Z20822 Contact with and (suspected) exposure to covid-19: Secondary | ICD-10-CM | POA: Diagnosis not present

## 2023-06-28 DIAGNOSIS — Z79899 Other long term (current) drug therapy: Secondary | ICD-10-CM | POA: Diagnosis not present

## 2023-06-28 DIAGNOSIS — Z91040 Latex allergy status: Secondary | ICD-10-CM | POA: Insufficient documentation

## 2023-06-28 LAB — CBC WITH DIFFERENTIAL/PLATELET
Abs Immature Granulocytes: 0.02 10*3/uL (ref 0.00–0.07)
Basophils Absolute: 0 10*3/uL (ref 0.0–0.1)
Basophils Relative: 0 %
Eosinophils Absolute: 0 10*3/uL (ref 0.0–0.5)
Eosinophils Relative: 1 %
HCT: 39.4 % (ref 36.0–46.0)
Hemoglobin: 13.3 g/dL (ref 12.0–15.0)
Immature Granulocytes: 0 %
Lymphocytes Relative: 13 %
Lymphs Abs: 0.8 10*3/uL (ref 0.7–4.0)
MCH: 29.6 pg (ref 26.0–34.0)
MCHC: 33.8 g/dL (ref 30.0–36.0)
MCV: 87.8 fL (ref 80.0–100.0)
Monocytes Absolute: 0.6 10*3/uL (ref 0.1–1.0)
Monocytes Relative: 10 %
Neutro Abs: 4.3 10*3/uL (ref 1.7–7.7)
Neutrophils Relative %: 76 %
Platelets: 188 10*3/uL (ref 150–400)
RBC: 4.49 MIL/uL (ref 3.87–5.11)
RDW: 13.9 % (ref 11.5–15.5)
WBC: 5.7 10*3/uL (ref 4.0–10.5)
nRBC: 0 % (ref 0.0–0.2)

## 2023-06-28 LAB — BASIC METABOLIC PANEL
Anion gap: 11 (ref 5–15)
BUN: 16 mg/dL (ref 8–23)
CO2: 24 mmol/L (ref 22–32)
Calcium: 9.3 mg/dL (ref 8.9–10.3)
Chloride: 98 mmol/L (ref 98–111)
Creatinine, Ser: 0.77 mg/dL (ref 0.44–1.00)
GFR, Estimated: >60 mL/min (ref >60)
Glucose, Bld: 197 mg/dL — ABNORMAL HIGH (ref 70–99)
Potassium: 3.5 mmol/L (ref 3.5–5.1)
Sodium: 133 mmol/L — ABNORMAL LOW (ref 135–145)

## 2023-06-28 LAB — RESP PANEL BY RT-PCR (RSV, FLU A&B, COVID)  RVPGX2
Influenza A by PCR: POSITIVE — AB
Influenza B by PCR: NEGATIVE
Resp Syncytial Virus by PCR: NEGATIVE
SARS Coronavirus 2 by RT PCR: NEGATIVE

## 2023-06-28 MED ORDER — ALBUTEROL SULFATE HFA 108 (90 BASE) MCG/ACT IN AERS
2.0000 | INHALATION_SPRAY | Freq: Four times a day (QID) | RESPIRATORY_TRACT | Status: DC | PRN
  Administered 2023-06-28: 2 via RESPIRATORY_TRACT
  Filled 2023-06-28: qty 6.7

## 2023-06-28 MED ORDER — OSELTAMIVIR PHOSPHATE 75 MG PO CAPS: 75.0000 mg | ORAL_CAPSULE | Freq: Two times a day (BID) | ORAL | 0 refills | Status: AC

## 2023-06-28 MED ORDER — ACETAMINOPHEN 325 MG PO TABS
650.0000 mg | ORAL_TABLET | Freq: Once | ORAL | Status: AC
  Administered 2023-06-28: 650 mg via ORAL
  Filled 2023-06-28: qty 2

## 2023-06-28 MED ORDER — OSELTAMIVIR PHOSPHATE 75 MG PO CAPS
75.0000 mg | ORAL_CAPSULE | Freq: Once | ORAL | Status: AC
  Administered 2023-06-28: 75 mg via ORAL
  Filled 2023-06-28: qty 1

## 2023-06-28 NOTE — ED Triage Notes (Signed)
Patient reports generalized body aches, intermittent subjective fevers, chest congestion, and dry cough x 3 days. Sx worsened yesterday.

## 2023-06-28 NOTE — ED Provider Notes (Signed)
Breanna Nash Provider Note   CSN: [id number] Arrival date & time: 06/28/23  1257     History  Chief Complaint  Patient presents with   Generalized Body Aches   Fever    Breanna Nash is a 83 y.o. female, history of hypertension, CAD, diabetes, who presents to the ED secondary to sore throat, body aches, fevers, chills, and cough this been going on for the last day and a half.  She states this started Thursday night, and has progressively gotten worse.  She states she feels just very weak, and has a reduced appetite.  States she is also having fevers, unknown last fever.  States that she feels like she is got a productive wet cough, but denies any shortness of breath, chest pain, or abdominal pain.   Home Medications Prior to Admission medications   Medication Sig Start Date End Date Taking? Authorizing Provider  oseltamivir (TAMIFLU) 75 MG capsule Take 1 capsule (75 mg total) by mouth every 12 (twelve) hours. 06/28/23  Yes Myking Sar L, PA  albuterol (VENTOLIN HFA) 108 (90 Base) MCG/ACT inhaler Inhale 2 puffs into the lungs every 6 (six) hours as needed for wheezing or shortness of breath. 02/23/23 02/23/24  [provider]  amLODipine (NORVASC) 10 MG tablet Take 1 tablet (10 mg total) by mouth daily. 06/07/23   Elgergawy, Leana Roe, MD  atenolol (TENORMIN) 50 MG tablet Take 1 tablet (50 mg total) by mouth daily. 06/09/23   Elgergawy, Leana Roe, MD  atorvastatin (LIPITOR) 80 MG tablet Take 1 tablet by mouth daily. 09/06/21   [provider]  clopidogrel (PLAVIX) 75 MG tablet Take 1 tablet (75 mg total) by mouth daily. 06/06/23   Elgergawy, Leana Roe, MD  cyclobenzaprine (FLEXERIL) 10 MG tablet Take 10 mg by mouth 2 (two) times daily as needed for muscle spasms. 05/23/23   [provider]  ezetimibe (ZETIA) 10 MG tablet Take 1 tablet (10 mg total) by mouth daily. 06/05/23   Elgergawy, Leana Roe, MD  insulin degludec  (TRESIBA) 100 UNIT/ML FlexTouch Pen Inject 20 Units into the skin at bedtime. May increase 2 units every 3 days if sugars aren't at goal <130. Max dose is 30 units. 03/13/23   [provider]  losartan-hydrochlorothiazide (HYZAAR) 100-12.5 MG tablet Take 1 tablet by mouth daily. 06/08/23   Elgergawy, Leana Roe, MD  metFORMIN (GLUCOPHAGE) 500 MG tablet Take 500 mg by mouth daily with breakfast. 04/22/23   [provider]  pantoprazole (PROTONIX) 40 MG tablet Take 1 tablet (40 mg total) by mouth daily. 06/05/23   Elgergawy, Leana Roe, MD      Allergies    Latex, Penicillins, Oxycodone, Percocet [oxycodone-acetaminophen], Oxycodone-acetaminophen, and Penicillin g    Review of Systems   Review of Systems  Constitutional:  Positive for chills and fever.  Respiratory:  Positive for cough. Negative for shortness of breath.     Physical Exam Updated Vital Signs BP (!) 157/66   Pulse 97   Temp 98.4 F (36.9 C)   Resp 18   Ht 5\' 3"  (1.6 m)   Wt 79.4 kg   SpO2 98%   BMI 31.00 kg/m  Physical Exam Vitals and nursing note reviewed.  Constitutional:      General: She is not in acute distress.    Appearance: She is well-developed.  HENT:     Head: Normocephalic and atraumatic.  Eyes:     Conjunctiva/sclera: Conjunctivae normal.  Cardiovascular:  Rate and Rhythm: Normal rate and regular rhythm.     Heart sounds: No murmur heard. Pulmonary:     Effort: Pulmonary effort is normal. No respiratory distress.     Breath sounds: Normal breath sounds. Decreased air movement present.  Abdominal:     Palpations: Abdomen is soft.     Tenderness: There is no abdominal tenderness.  Musculoskeletal:        General: No swelling.     Cervical back: Neck supple.  Skin:    General: Skin is warm and dry.     Capillary Refill: Capillary refill takes less than 2 seconds.  Neurological:     Mental Status: She is alert.  Psychiatric:        Mood and Affect: Mood normal.     ED  Results / Procedures / Treatments   Labs (all labs ordered are listed, but only abnormal results are displayed) Labs Reviewed  RESP PANEL BY RT-PCR (RSV, FLU A&B, COVID)  RVPGX2 - Abnormal; Notable for the following components:      Result Value   Influenza A by PCR POSITIVE (*)    All other components within normal limits  BASIC METABOLIC PANEL - Abnormal; Notable for the following components:   Sodium 133 (*)    Glucose, Bld 197 (*)    All other components within normal limits  CBC WITH DIFFERENTIAL/PLATELET    EKG None  Radiology No results found.  Procedures Procedures    Medications Ordered in ED Medications  albuterol (VENTOLIN HFA) 108 (90 Base) MCG/ACT inhaler 2 puff (2 puffs Inhalation Given by Other 06/28/23 1452)  acetaminophen (TYLENOL) tablet 650 mg (650 mg Oral Given 06/28/23 1314)  oseltamivir (TAMIFLU) capsule 75 mg (75 mg Oral Given 06/28/23 1452)    ED Course/ Medical Decision Making/ A&P                                 Medical Decision Making Patient is a 83 year old female, here for cough, body aches, fever, that is been going on for the last day and a half.  Lungs are clear to auscultation just lightly just diminished, we will give her some albuterol, as well as obtain a CBC, CMP, COVID/flu.   Amount and/or Complexity of Data Reviewed Labs:     Details: Influenza A positive, labs within normal limits Discussion of management or test interpretation with external provider(s): , Patient is influenza A positive, which explains her generalized weakness, fatigue.  Labs are unremarkable.  This is reassuring.  She has slightly diminished breath sounds this albuterol inhaler was prescribed.  She was instructed to follow-up with her PCP, and return if her symptoms worsen.  I prescribed her Tamiflu, she is at increased risk, given her age, and comorbidities.  We discussed return precautions she voiced understanding, discharged home  Risk OTC drugs. Prescription drug  management.    Final Clinical Impression(s) / ED Diagnoses Final diagnoses:  Influenza A    Rx / DC Orders ED Discharge Orders          Ordered    oseltamivir (TAMIFLU) 75 MG capsule  Every 12 hours        06/28/23 1525              Chevez Sambrano, [place], [place] 06/28/23 1529    Durwin Glaze, MD 06/29/23 [phone]

## 2023-06-28 NOTE — Discharge Instructions (Addendum)
You have the flu, please make sure you are drinking lots of fluids, and taking Tylenol for your fever.  I sent you some Tamiflu, this may cause some nausea/vomiting, so you should take it with some food.  Return to the ER if you have severe shortness of breath, chest pain, or oxygen saturations less than 90%.

## 2023-06-28 NOTE — ED Notes (Signed)
Reviewed discharge instructions, follow and medication with pt and POA whom is her son. States understanding. Afebrile upon discharge with husband

## 2023-07-09 ENCOUNTER — Ambulatory Visit (INDEPENDENT_AMBULATORY_CARE_PROVIDER_SITE_OTHER): Payer: Medicare Other | Admitting: Adult Health

## 2023-07-09 ENCOUNTER — Encounter: Payer: Self-pay | Admitting: Adult Health

## 2023-07-09 VITALS — BP 148/73 | HR 88 | Ht 63.0 in | Wt 177.0 lb

## 2023-07-09 DIAGNOSIS — I639 Cerebral infarction, unspecified: Secondary | ICD-10-CM

## 2023-07-09 DIAGNOSIS — E785 Hyperlipidemia, unspecified: Secondary | ICD-10-CM

## 2023-07-09 DIAGNOSIS — Z794 Long term (current) use of insulin: Secondary | ICD-10-CM | POA: Diagnosis not present

## 2023-07-09 DIAGNOSIS — E118 Type 2 diabetes mellitus with unspecified complications: Secondary | ICD-10-CM

## 2023-07-09 NOTE — Patient Instructions (Signed)
Your Plan:  Continue plavix  Blood pressure goal <130/90 Cholesterol LDL goal <70 Diabetes goal A1c <7 Monitor diet and try to exercise   Thank you for coming to see [place] at [facility]. I hope we have been able to provide you high quality care today.  You may receive a patient satisfaction survey over the next few weeks. We would appreciate your feedback and comments so that we may continue to improve ourselves and the health of our patients.

## 2023-07-09 NOTE — Progress Notes (Signed)
PATIENT: Breanna Nash DOB: Jun 02, 1940  REASON FOR VISIT: follow up HISTORY FROM: patient PRIMARY NEUROLOGIST: Dr. Pearlean Brownie  Chief Complaint  Patient presents with   Follow-up    Patient In room #5 with her husband. Patient states she well but sometime she feels off balance.      HISTORY OF PRESENT ILLNESS: Today 07/09/23  Breanna Nash is a 83 y.o. 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  Patient states since her stroke on occasion she will notice that the left side drags at times.  She also feels off balance.  She states prior to the stroke she was having some trouble with her balance.  She is currently in physical therapy.  No cognitive changes since her stroke.  She is currently on Plavix.  She reports that she has not taken her blood pressure medicine today.  She is on Zetia and Lipitor for her cholesterol.  Patient did inquire about driving.  She does not have any physical deficits that should limit her ability to drive however I did advise patient that if she does not feel comfortable driving she should not get back behind the wheel.  She returns today for evaluation  MRI Brain:  IMPRESSION: 1.  No acute intracranial abnormality.   2. Punctate focus of hyperintense signal on diffusion-weighted imaging in the right frontal lobe without a correlate on the ADC map. This could represent a subacute infarct.   CTA head and neck: IMPRESSION: 1. Negative CTA for large vessel occlusion or other emergent finding. 2. Mild for age atheromatous change about the carotid bifurcations and carotid siphons without hemodynamically significant or correctable stenosis. 3. Atheromatous plaque at the origin of the left vertebral artery with associated moderate stenosis. 4. Atheromatous irregularity about the PCAs bilaterally with associated moderate diffuse narrowing of the right P2  segment.    HISTORY Breanna Nash is a 83 y.o. female with history of hypertension, hyperlipidemia, CAD, TIA, diabetes, GERD and obesity who presents with an acute onset episode of dizziness which caused her to need to sit down abruptly as well as left-sided numbness and weakness.  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 Small vessel disease. ASPECTS 10.    CTA head & neck no LVO, moderate stenosis of the origin of the left vertebral artery, narrowing of right P2 segment MRI punctate acute infarct in the right frontal lobe 2D Echo EF 60 to 65% LDL 93 UDS negative HgbA1c 6.4 VTE prophylaxis -Lovenox ASA 325 daily prior to admission, now on aspirin 81 mg daily and clopidogrel 75 mg daily for 3 weeks followed by Plavix alone Therapy recommendations: No PT/OT follow-up needed Disposition: Home    REVIEW OF SYSTEMS: Out of a complete 14 system review of symptoms, the patient complains only of the following symptoms, and all other reviewed systems are negative.  ALLERGIES: Allergies  Allergen Reactions   Latex Dermatitis and Itching    Leave red itch places  Other Reaction(s): Dermatitis, Other  Leaves red patch  Leave red itch places   Leave red itch places   Leave red itch places  Leaves red patch   Penicillins Rash and Swelling   Oxycodone Nausea And Vomiting    Other Reaction(s): GI Intolerance   Percocet [Oxycodone-Acetaminophen] Nausea And Vomiting   Oxycodone-Acetaminophen Nausea And Vomiting   Penicillin G Rash    HOME MEDICATIONS: Outpatient Medications Prior to Visit  Medication Sig Dispense Refill   albuterol  (VENTOLIN HFA) 108 (90 Base) MCG/ACT inhaler Inhale 2 puffs into the lungs every 6 (six) hours as needed for wheezing or shortness of breath.     amLODipine (NORVASC) 10 MG tablet Take 1 tablet (10 mg total) by mouth daily.     atenolol (TENORMIN) 50 MG tablet Take 1 tablet (50 mg total) by mouth daily.     atorvastatin (LIPITOR) 80 MG tablet Take 1 tablet by mouth daily.     budesonide-formoterol (SYMBICORT) 80-4.5 MCG/ACT inhaler Inhale 2 puffs into the lungs.     clopidogrel (PLAVIX) 75 MG tablet Take 1 tablet (75 mg total) by mouth daily. 90 tablet 0   ezetimibe (ZETIA) 10 MG tablet Take 1 tablet (10 mg total) by mouth daily. 30 tablet 0   insulin degludec (TRESIBA) 100 UNIT/ML FlexTouch Pen Inject 20 Units into the skin at bedtime. May increase 2 units every 3 days if sugars aren't at goal <130. Max dose is 30 units.     losartan-hydrochlorothiazide (HYZAAR) 100-12.5 MG tablet Take 1 tablet by mouth daily.     metFORMIN (GLUCOPHAGE) 500 MG tablet Take 500 mg by mouth daily with breakfast.     pantoprazole (PROTONIX) 40 MG tablet Take 1 tablet (40 mg total) by mouth daily. 90 tablet 0   cyclobenzaprine (FLEXERIL) 10 MG tablet Take 10 mg by mouth 2 (two) times daily as needed for muscle spasms. (Patient not taking: Reported on 07/09/2023)     oseltamivir (TAMIFLU) 75 MG capsule Take 1 capsule (75 mg total) by mouth every 12 (twelve) hours. (Patient not taking: Reported on 07/09/2023) 9 capsule 0   No facility-administered medications prior to visit.    PAST MEDICAL HISTORY: Past Medical History:  Diagnosis Date   CAD (coronary artery disease)    CVD (cerebrovascular disease)    Diabetes mellitus type 2 with complications (HCC)    HTN (hypertension)    TIA (transient ischemic attack)     PAST SURGICAL HISTORY: History reviewed. No pertinent surgical history.  FAMILY HISTORY: History reviewed. No pertinent family history.  SOCIAL HISTORY: Social History   Socioeconomic History    Marital status: Married    Spouse name: Not on file   Number of children: Not on file   Years of education: Not on file   Highest education level: Not on file  Occupational History   Not on file  Tobacco Use   Smoking status: Never   Smokeless tobacco: Never  Substance and Sexual Activity   Alcohol use: Never   Drug use: Never   Sexual activity: Never  Other Topics Concern   Not on file  Social History Narrative   Not on file   Social Drivers of Health   Financial Resource Strain: Not on file  Food Insecurity: No Food Insecurity (06/04/2023)   Hunger Vital Sign    Worried About Running Out of Food in the Last Year: Never true    Ran Out of Food in the Last Year: Never true  Transportation Needs: Unmet Transportation Needs (06/04/2023)   PRAPARE - Administrator, Civil Service (Medical): No    Lack of Transportation (Non-Medical): Yes  Physical Activity: Not on file  Stress: Not on file  Social Connections: Socially Integrated (06/04/2023)   Social Connection and Isolation Panel [NHANES]    Frequency of Communication with Friends and Family: Three times a week    Frequency of Social Gatherings with Friends and Family: Once a week    Attends Religious Services: More than 4 times per year    Active Member of [employer] or Organizations: Yes    Attends Banker Meetings: More than 4 times per year    Marital Status: Living with partner  Intimate Partner Violence: Unknown (06/04/2023)   Humiliation, Afraid, Rape, and Kick questionnaire    Fear of Current or Ex-Partner: No    Emotionally Abused: No    Physically Abused: No    Sexually Abused: Not on file      PHYSICAL EXAM  Vitals:   07/09/23 0909  BP: (!) 148/73  Pulse: 88  Weight: 177 lb (80.3 kg)  Height: 5\' 3"  (1.6 m)   Body mass index is 31.35 kg/m.  Generalized: Well developed, in no acute distress   Neurological examination  Mentation: Alert oriented to time, place, history taking. Follows  all commands speech and language fluent Cranial nerve II-XII: Pupils were equal round reactive to light. Extraocular movements were full, visual field were full on confrontational test. Facial sensation and strength were normal. Uvula tongue midline. Head turning and shoulder shrug  were normal and symmetric. Motor: The motor testing reveals 5 over 5 strength of all 4 extremities. Good symmetric motor tone is noted throughout.  Sensory: Sensory testing is intact to soft touch on all 4 extremities. No evidence of extinction is noted.  Coordination: Cerebellar testing reveals good finger-nose-finger and heel-to-shin bilaterally.  Gait and station: Gait is normal. Tandem gait not attempted.   DIAGNOSTIC DATA (LABS, IMAGING, TESTING) - I reviewed patient records, labs, notes, testing and imaging myself where available.  Lab Results  Component Value Date   WBC 5.7 06/28/2023   HGB 13.3 06/28/2023   HCT 39.4 06/28/2023   MCV 87.8 06/28/2023   PLT 188 06/28/2023      Component Value Date/Time   NA 133 (L) 06/28/2023 1419   K 3.5 06/28/2023 1419   CL 98 06/28/2023 1419   CO2 24 06/28/2023 1419   GLUCOSE 197 (H) 06/28/2023 1419   BUN 16 06/28/2023 1419   CREATININE 0.77 06/28/2023 1419   CALCIUM 9.3 06/28/2023 1419   PROT 8.0 06/03/2023 2225   ALBUMIN 3.8 06/03/2023 2225   AST 30 06/03/2023 2225   ALT 32 06/03/2023 2225   ALKPHOS 115 06/03/2023 2225   BILITOT 0.4 06/03/2023 2225   GFRNONAA >60 06/28/2023 1419   GFRAA >60 08/23/2016 0937   Lab Results  Component Value Date   CHOL 134 06/04/2023   HDL 32 (L) 06/04/2023   LDLCALC 93 06/04/2023   TRIG 47 06/04/2023   CHOLHDL 4.2 06/04/2023   Lab Results  Component Value Date   HGBA1C 6.4 (H) 06/04/2023   No results found for: "VITAMINB12" Lab Results  Component Value Date   TSH 0.958 06/04/2023      ASSESSMENT AND PLAN 83 y.o. year old female  has a past medical history of CAD (coronary artery disease), CVD  (cerebrovascular disease), Diabetes mellitus type 2 with complications (HCC), HTN (hypertension), and TIA (transient ischemic attack). 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   Discussed secondary stroke prevention measures and importance of close PCP follow up for aggressive stroke risk factor management. I have gone over the pathophysiology of stroke, warning signs and symptoms, risk factors and their management in some detail with instructions to go to the closest emergency room for symptoms of concern. HTN: BP goal <130/90.  Slightly elevated today however she did not take her blood pressure medicine this morning HLD: LDL goal <70. Recent LDL 93.  DMII: A1c goal<7.0. Recent A1c 6.4.  Encouraged patient to monitor diet and encouraged exercise FU with our office 6-8 months or sooner if needed       Butch Penny, MSN, NP-C 07/09/2023, 9:38 AM [facility] [address] [place], [place] [address] [phone]

## 2023-07-11 NOTE — Progress Notes (Signed)
I agree with the above plan

## 2024-02-11 ENCOUNTER — Ambulatory Visit (INDEPENDENT_AMBULATORY_CARE_PROVIDER_SITE_OTHER): Payer: Medicare Other | Admitting: Adult Health

## 2024-02-11 ENCOUNTER — Encounter: Payer: Self-pay | Admitting: Adult Health

## 2024-02-11 VITALS — BP 154/72 | HR 65 | Ht 62.0 in | Wt 170.0 lb

## 2024-02-11 DIAGNOSIS — I10 Essential (primary) hypertension: Secondary | ICD-10-CM | POA: Diagnosis not present

## 2024-02-11 DIAGNOSIS — I639 Cerebral infarction, unspecified: Secondary | ICD-10-CM

## 2024-02-11 NOTE — Patient Instructions (Signed)
 Your Plan:    Continue Plavix  Blood pressure goal <130/90 Cholesterol LDL goal <70 Diabetes goal A1c <7 Monitor diet and try to exercise      Thank you for coming to see us  at [facility]. I hope we have been able to provide you high quality care today.  You may receive a patient satisfaction survey over the next few weeks. We would appreciate your feedback and comments so that we may continue to improve ourselves and the health of our patients.
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
## 2024-03-21 ENCOUNTER — Emergency Department (HOSPITAL_BASED_OUTPATIENT_CLINIC_OR_DEPARTMENT_OTHER)

## 2024-03-21 ENCOUNTER — Encounter (HOSPITAL_BASED_OUTPATIENT_CLINIC_OR_DEPARTMENT_OTHER): Payer: Self-pay | Admitting: Emergency Medicine

## 2024-03-21 ENCOUNTER — Other Ambulatory Visit: Payer: Self-pay

## 2024-03-21 ENCOUNTER — Emergency Department (HOSPITAL_BASED_OUTPATIENT_CLINIC_OR_DEPARTMENT_OTHER): Admission: EM | Admit: 2024-03-21 | Discharge: 2024-03-22 | Disposition: A | Discharge status: Home / Self Care

## 2024-03-21 DIAGNOSIS — Z7984 Long term (current) use of oral hypoglycemic drugs: Secondary | ICD-10-CM | POA: Insufficient documentation

## 2024-03-21 DIAGNOSIS — Z8673 Personal history of transient ischemic attack (TIA), and cerebral infarction without residual deficits: Secondary | ICD-10-CM | POA: Diagnosis not present

## 2024-03-21 DIAGNOSIS — E119 Type 2 diabetes mellitus without complications: Secondary | ICD-10-CM | POA: Insufficient documentation

## 2024-03-21 DIAGNOSIS — Z7902 Long term (current) use of antithrombotics/antiplatelets: Secondary | ICD-10-CM | POA: Diagnosis not present

## 2024-03-21 DIAGNOSIS — M79632 Pain in left forearm: Secondary | ICD-10-CM | POA: Insufficient documentation

## 2024-03-21 DIAGNOSIS — R519 Headache, unspecified: Secondary | ICD-10-CM | POA: Insufficient documentation

## 2024-03-21 DIAGNOSIS — I10 Essential (primary) hypertension: Secondary | ICD-10-CM | POA: Insufficient documentation

## 2024-03-21 DIAGNOSIS — Z91040 Latex allergy status: Secondary | ICD-10-CM | POA: Insufficient documentation

## 2024-03-21 DIAGNOSIS — M79603 Pain in arm, unspecified: Secondary | ICD-10-CM

## 2024-03-21 DIAGNOSIS — Z79899 Other long term (current) drug therapy: Secondary | ICD-10-CM | POA: Insufficient documentation

## 2024-03-21 DIAGNOSIS — I2510 Atherosclerotic heart disease of native coronary artery without angina pectoris: Secondary | ICD-10-CM | POA: Insufficient documentation

## 2024-03-21 DIAGNOSIS — Z794 Long term (current) use of insulin: Secondary | ICD-10-CM | POA: Insufficient documentation

## 2024-03-21 LAB — BASIC METABOLIC PANEL WITH GFR
Anion gap: 12 (ref 5–15)
BUN: 13 mg/dL (ref 8–23)
CO2: 25 mmol/L (ref 22–32)
Calcium: 9.5 mg/dL (ref 8.9–10.3)
Chloride: 105 mmol/L (ref 98–111)
Creatinine, Ser: 0.68 mg/dL (ref 0.44–1.00)
GFR, Estimated: >60 mL/min (ref >60)
Glucose, Bld: 115 mg/dL — ABNORMAL HIGH (ref 70–99)
Potassium: 3.8 mmol/L (ref 3.5–5.1)
Sodium: 141 mmol/L (ref 135–145)

## 2024-03-21 LAB — CBC
HCT: 40.1 % (ref 36.0–46.0)
Hemoglobin: 13.7 g/dL (ref 12.0–15.0)
MCH: 30.2 pg (ref 26.0–34.0)
MCHC: 34.2 g/dL (ref 30.0–36.0)
MCV: 88.3 fL (ref 80.0–100.0)
Platelets: 190 K/uL (ref 150–400)
RBC: 4.54 MIL/uL (ref 3.87–5.11)
RDW: 13.8 % (ref 11.5–15.5)
WBC: 6.9 K/uL (ref 4.0–10.5)
nRBC: 0 % (ref 0.0–0.2)

## 2024-03-21 LAB — TROPONIN T, HIGH SENSITIVITY
Troponin T High Sensitivity: <15 ng/L (ref 0–19)
Troponin T High Sensitivity: <15 ng/L (ref 0–19)

## 2024-03-21 NOTE — ED Notes (Signed)
 XR at bedside

## 2024-03-21 NOTE — ED Provider Notes (Signed)
 [place] [facility] Provider Note   CSN: [id number] Arrival date & time: 03/21/24  1700     Patient presents with: Arm Pain   Ravonda Brecheen is a 83 y.o. female with past medical history of T2DM, HLD, BMI 31, CAD, CVA (on plavix ) left-sided sensory deficit since 05/2023 presents Emergency Department for evaluation of left forearm pain that started last night following talking on the phone with her husband at 1830.  She reports that she was aggravated with her husband on the phone when pain started.  Pain described as a throbbing.  Pain lasts for a few minutes and spontaneously resolves.  Has been intermittently occurring since yesterday.  No current pain.  Has not tried any OTC medication prior to arrival.  Patient reports that husband did not say that patient had slurred speech, aphasia, nor altered mentation.  Patient has not had any recent falls.  Denies chest pain, shortness of breath   {Add pertinent medical, surgical, social history, OB history to HPI:32947}  Arm Pain       Prior to Admission medications   Medication Sig Start Date End Date Taking? Authorizing Provider  albuterol  (VENTOLIN  HFA) 108 (90 Base) MCG/ACT inhaler Inhale 2 puffs into the lungs every 6 (six) hours as needed for wheezing or shortness of breath. 02/23/23 02/23/24  [provider]  amLODipine  (NORVASC ) 10 MG tablet Take 1 tablet (10 mg total) by mouth daily. 06/07/23   Elgergawy, Brayton RAMAN, MD  atenolol  (TENORMIN ) 50 MG tablet Take 1 tablet (50 mg total) by mouth daily. 06/09/23   Elgergawy, Brayton RAMAN, MD  atorvastatin  (LIPITOR ) 80 MG tablet Take 1 tablet by mouth daily. 09/06/21   [provider]  budesonide-formoterol (SYMBICORT) 80-4.5 MCG/ACT inhaler Inhale 2 puffs into the lungs. 07/07/23 07/06/24  [provider]  clopidogrel  (PLAVIX ) 75 MG tablet Take 1 tablet (75 mg total) by mouth daily. 06/06/23   Elgergawy, Brayton RAMAN, MD  cyclobenzaprine  (FLEXERIL) 10 MG tablet Take 10 mg by mouth 2 (two) times daily as needed for muscle spasms. Patient not taking: Reported on 07/09/2023 05/23/23   [provider]  ezetimibe  (ZETIA ) 10 MG tablet Take 1 tablet (10 mg total) by mouth daily. 06/05/23   Elgergawy, Brayton RAMAN, MD  insulin  degludec (TRESIBA) 100 UNIT/ML FlexTouch Pen Inject 20 Units into the skin at bedtime. May increase 2 units every 3 days if sugars aren't at goal <130. Max dose is 30 units. 03/13/23   [provider]  losartan -hydrochlorothiazide (HYZAAR) 100-12.5 MG tablet Take 1 tablet by mouth daily. 06/08/23   Elgergawy, Brayton RAMAN, MD  metFORMIN (GLUCOPHAGE) 500 MG tablet Take 500 mg by mouth daily with breakfast. 04/22/23   [provider]  oseltamivir  (TAMIFLU ) 75 MG capsule Take 1 capsule (75 mg total) by mouth every 12 (twelve) hours. Patient not taking: Reported on 07/09/2023 06/28/23   Small, Brooke L, PA  pantoprazole  (PROTONIX ) 40 MG tablet Take 1 tablet (40 mg total) by mouth daily. 06/05/23   Elgergawy, Brayton RAMAN, MD    Allergies: Latex, Penicillins, Oxycodone, Percocet [oxycodone-acetaminophen ], Oxycodone-acetaminophen , and Penicillin g    Review of Systems  Skin:  Negative for wound.    Updated Vital Signs BP (!) 146/54   Pulse 67   Temp 98.4 F (36.9 C) (Oral)   Resp 10   Ht 5' 2 (1.575 m)   Wt 77.1 kg   SpO2 100%   BMI 31.09 kg/m   Physical Exam Vitals and  nursing note reviewed.  Constitutional:      General: She is not in acute distress.    Appearance: Normal appearance.  HENT:     Head: Normocephalic and atraumatic.  Eyes:     Conjunctiva/sclera: Conjunctivae normal.  Cardiovascular:     Rate and Rhythm: Normal rate.     Pulses:          Radial pulses are 2+ on the right side and 2+ on the left side.  Pulmonary:     Effort: Pulmonary effort is normal. No respiratory distress.  Musculoskeletal:     Cervical back: Full passive range of motion without pain. No pain with  movement or spinous process tenderness.     Comments: No tenderness to entirety of long bones and joints of left arm.  No motor deficit.  Does have 1/2 sensation of left side from previous CVA.  Able to fully flex and extend all joints appropriately.  No signs of infection, erythema, swelling to joints or long bones of left arm  Skin:    Coloration: Skin is not jaundiced or pale.  Neurological:     Mental Status: She is alert. Mental status is at baseline.     (all labs ordered are listed, but only abnormal results are displayed) Labs Reviewed - No data to display  EKG: None  Radiology: No results found.  {Document cardiac monitor, telemetry assessment procedure when appropriate:32947} Procedures   Medications Ordered in the ED - No data to display    {Click here for ABCD2, HEART and other calculators REFRESH Note before signing:1}                              Medical Decision Making Amount and/or Complexity of Data Reviewed Labs: ordered. Radiology: ordered.   Patient presents to the ED for concern of left arm pain, this involves an extensive number of treatment options, and is a complaint that carries with it a high risk of complications and morbidity.  The differential diagnosis includes fracture, dislocation, ACS, DVT, cellulitis, vascular injury, ischemic limb   Co morbidities that complicate the patient evaluation  Previous history of stroke with left sided decree sensation deficit in 06/03/2023   Additional history obtained:  Additional history obtained from Nursing, Outside Medical Records, and Past Admission   External records from outside source obtained and reviewed including triage RN note, admission note from 06/03/2023 for stroke   Lab Tests:  I Ordered, and personally interpreted labs.  The pertinent results include:  ***   Imaging Studies ordered:  I ordered imaging studies including Left forearm XR, LUE DVT ultrasound I independently visualized and  interpreted imaging which showed no acute abnormalities I agree with the radiologist interpretation See independent impressions for full details   Cardiac Monitoring:  The patient was maintained on a cardiac monitor.  I personally viewed and interpreted the cardiac monitored which showed an underlying rhythm of: NSR at 75 bpm with no ST no T wave abnormalities    Problem List / ED Course:  Left arm pain No pain currently Intermittent left forearm pain Easily palpable radial pulse 2+ equal bilaterally Does have 1/2 sensory deficit to LUE but has had this since her stroke in January 2025.  Neurology note notes this deficit No hx of trauma, falls. XR neg for fx DVT US  neg for DVT    Reevaluation:  After the interventions noted above, I reevaluated the patient and found that  they have :stayed the same     Dispostion:  After consideration of the diagnostic results and the patients response to treatment, I feel that the patent would benefit from ***.    {Document critical care time when appropriate  Document review of labs and clinical decision tools ie CHADS2VASC2, etc  Document your independent review of radiology images and any outside records  Document your discussion with family members, caretakers and with consultants  Document social determinants of health affecting pt's care  Document your decision making why or why not admission, treatments were needed:32947:::1}   Final diagnoses:  None    ED Discharge Orders     None

## 2024-03-21 NOTE — Discharge Instructions (Addendum)
 Thank for letting us  evaluate you today.  Your heart function is normal.  Your chest x-ray did not show any pneumonia nor fluid.  Your CT did not show any bleeding on the brain.  Your ultrasound of your arm did not show any blood clot.  Your x-ray of your arm did not show any fracture or an infection.  Your electrolytes are within normal limits.  Please follow-up with primary care provider for further management  Return to Emergency Department for experience altered mentation, numbness or weakness on one side of your body that is new, inability to walk, severe debilitating headache, loss of vision, worsening symptoms

## 2024-03-21 NOTE — ED Triage Notes (Signed)
 Pt reports intermittent pain to LUE (below elbow) that started yesterday after she got upset; denies other sxs
